# Patient Record
Sex: Female | Born: 1975
Health system: Southern US, Community
[De-identification: ages and names within clinical notes are randomized; demographics above are authoritative.]

## PROBLEM LIST (undated history)

## (undated) DIAGNOSIS — F17200 Nicotine dependence, unspecified, uncomplicated: Secondary | ICD-10-CM

## (undated) DIAGNOSIS — F988 Other specified behavioral and emotional disorders with onset usually occurring in childhood and adolescence: Secondary | ICD-10-CM

## (undated) DIAGNOSIS — E785 Hyperlipidemia, unspecified: Secondary | ICD-10-CM

## (undated) DIAGNOSIS — R079 Chest pain, unspecified: Secondary | ICD-10-CM

## (undated) DIAGNOSIS — E119 Type 2 diabetes mellitus without complications: Secondary | ICD-10-CM

## (undated) DIAGNOSIS — E559 Vitamin D deficiency, unspecified: Secondary | ICD-10-CM

## (undated) DIAGNOSIS — I471 Supraventricular tachycardia, unspecified: Secondary | ICD-10-CM

## (undated) DIAGNOSIS — R51 Headache: Secondary | ICD-10-CM

## (undated) DIAGNOSIS — E669 Obesity, unspecified: Secondary | ICD-10-CM

## (undated) DIAGNOSIS — F410 Panic disorder [episodic paroxysmal anxiety] without agoraphobia: Secondary | ICD-10-CM

## (undated) HISTORY — DX: Nicotine dependence, unspecified, uncomplicated: F17.200

## (undated) HISTORY — DX: Other specified behavioral and emotional disorders with onset usually occurring in childhood and adolescence: F98.8

## (undated) HISTORY — DX: Type 2 diabetes mellitus without complications: E11.9

## (undated) HISTORY — DX: Vitamin D deficiency, unspecified: E55.9

## (undated) HISTORY — DX: Supraventricular tachycardia, unspecified: I47.10

## (undated) HISTORY — DX: Headache: R51

## (undated) HISTORY — PX: WISDOM TOOTH EXTRACTION: SHX21

## (undated) HISTORY — DX: Hyperlipidemia, unspecified: E78.5

## (undated) HISTORY — DX: Chest pain, unspecified: R07.9

## (undated) HISTORY — DX: Obesity, unspecified: E66.9

## (undated) HISTORY — DX: Panic disorder (episodic paroxysmal anxiety): F41.0

---

## 2001-07-03 ENCOUNTER — Other Ambulatory Visit: Admission: RE | Admit: 2001-07-03 | Discharge: 2001-07-03 | Payer: Self-pay | Admitting: Obstetrics and Gynecology

## 2001-07-22 ENCOUNTER — Encounter: Payer: Self-pay | Admitting: Obstetrics and Gynecology

## 2001-07-22 ENCOUNTER — Ambulatory Visit (HOSPITAL_COMMUNITY): Admission: RE | Admit: 2001-07-22 | Discharge: 2001-07-22 | Payer: Self-pay | Admitting: Obstetrics and Gynecology

## 2001-12-14 ENCOUNTER — Inpatient Hospital Stay (HOSPITAL_COMMUNITY): Admission: AD | Admit: 2001-12-14 | Discharge: 2001-12-17 | Payer: Self-pay | Admitting: *Deleted

## 2003-01-27 ENCOUNTER — Other Ambulatory Visit: Admission: RE | Admit: 2003-01-27 | Discharge: 2003-01-27 | Payer: Self-pay | Admitting: Obstetrics and Gynecology

## 2003-07-13 ENCOUNTER — Emergency Department (HOSPITAL_COMMUNITY): Admission: EM | Admit: 2003-07-13 | Discharge: 2003-07-14 | Payer: Self-pay | Admitting: Emergency Medicine

## 2003-07-13 ENCOUNTER — Encounter: Payer: Self-pay | Admitting: Emergency Medicine

## 2004-03-01 ENCOUNTER — Ambulatory Visit (HOSPITAL_COMMUNITY): Admission: RE | Admit: 2004-03-01 | Discharge: 2004-03-01 | Payer: Self-pay | Admitting: Family Medicine

## 2004-03-09 ENCOUNTER — Other Ambulatory Visit: Admission: RE | Admit: 2004-03-09 | Discharge: 2004-03-09 | Payer: Self-pay | Admitting: Obstetrics and Gynecology

## 2005-02-11 ENCOUNTER — Ambulatory Visit: Payer: Self-pay | Admitting: Internal Medicine

## 2005-04-12 ENCOUNTER — Ambulatory Visit: Payer: Self-pay | Admitting: Internal Medicine

## 2005-06-28 ENCOUNTER — Other Ambulatory Visit: Admission: RE | Admit: 2005-06-28 | Discharge: 2005-06-28 | Payer: Self-pay | Admitting: Obstetrics and Gynecology

## 2005-10-11 ENCOUNTER — Ambulatory Visit: Payer: Self-pay | Admitting: Internal Medicine

## 2006-03-07 ENCOUNTER — Ambulatory Visit: Payer: Self-pay | Admitting: Family Medicine

## 2006-10-29 ENCOUNTER — Ambulatory Visit: Payer: Self-pay | Admitting: Internal Medicine

## 2007-03-06 ENCOUNTER — Ambulatory Visit: Payer: Self-pay | Admitting: Family Medicine

## 2007-09-16 ENCOUNTER — Telehealth (INDEPENDENT_AMBULATORY_CARE_PROVIDER_SITE_OTHER): Payer: Self-pay | Admitting: *Deleted

## 2007-09-17 ENCOUNTER — Ambulatory Visit: Payer: Self-pay | Admitting: Internal Medicine

## 2008-02-07 ENCOUNTER — Encounter: Payer: Self-pay | Admitting: Emergency Medicine

## 2008-02-07 ENCOUNTER — Inpatient Hospital Stay (HOSPITAL_COMMUNITY): Admission: AD | Admit: 2008-02-07 | Discharge: 2008-02-07 | Payer: Self-pay | Admitting: Obstetrics and Gynecology

## 2008-02-10 ENCOUNTER — Inpatient Hospital Stay (HOSPITAL_COMMUNITY): Admission: RE | Admit: 2008-02-10 | Discharge: 2008-02-10 | Payer: Self-pay | Admitting: Obstetrics and Gynecology

## 2008-02-23 ENCOUNTER — Inpatient Hospital Stay (HOSPITAL_COMMUNITY): Admission: AD | Admit: 2008-02-23 | Discharge: 2008-02-23 | Payer: Self-pay | Admitting: Obstetrics and Gynecology

## 2008-05-05 ENCOUNTER — Telehealth (INDEPENDENT_AMBULATORY_CARE_PROVIDER_SITE_OTHER): Payer: Self-pay | Admitting: *Deleted

## 2008-05-05 ENCOUNTER — Emergency Department (HOSPITAL_COMMUNITY): Admission: EM | Admit: 2008-05-05 | Discharge: 2008-05-05 | Payer: Self-pay | Admitting: Emergency Medicine

## 2008-05-18 ENCOUNTER — Telehealth (INDEPENDENT_AMBULATORY_CARE_PROVIDER_SITE_OTHER): Payer: Self-pay | Admitting: *Deleted

## 2009-01-02 ENCOUNTER — Ambulatory Visit: Payer: Self-pay | Admitting: Internal Medicine

## 2009-01-02 DIAGNOSIS — R51 Headache: Secondary | ICD-10-CM

## 2009-01-02 DIAGNOSIS — E1169 Type 2 diabetes mellitus with other specified complication: Secondary | ICD-10-CM | POA: Insufficient documentation

## 2009-01-02 DIAGNOSIS — F988 Other specified behavioral and emotional disorders with onset usually occurring in childhood and adolescence: Secondary | ICD-10-CM | POA: Insufficient documentation

## 2009-01-02 DIAGNOSIS — R519 Headache, unspecified: Secondary | ICD-10-CM | POA: Insufficient documentation

## 2009-01-02 DIAGNOSIS — E785 Hyperlipidemia, unspecified: Secondary | ICD-10-CM | POA: Insufficient documentation

## 2009-01-02 DIAGNOSIS — F17200 Nicotine dependence, unspecified, uncomplicated: Secondary | ICD-10-CM

## 2009-01-02 HISTORY — DX: Nicotine dependence, unspecified, uncomplicated: F17.200

## 2009-01-02 HISTORY — DX: Hyperlipidemia, unspecified: E78.5

## 2009-01-02 HISTORY — DX: Other specified behavioral and emotional disorders with onset usually occurring in childhood and adolescence: F98.8

## 2009-01-02 HISTORY — DX: Headache: R51

## 2009-01-30 ENCOUNTER — Telehealth: Payer: Self-pay | Admitting: Internal Medicine

## 2009-02-24 ENCOUNTER — Encounter: Payer: Self-pay | Admitting: Internal Medicine

## 2009-11-23 ENCOUNTER — Ambulatory Visit: Payer: Self-pay | Admitting: Internal Medicine

## 2009-11-23 DIAGNOSIS — J069 Acute upper respiratory infection, unspecified: Secondary | ICD-10-CM | POA: Insufficient documentation

## 2010-12-18 NOTE — Assessment & Plan Note (Signed)
Summary: ? bronchitis//ccm   Vital Signs:  Patient profile:   35 year old female Weight:      287 pounds BMI:     43.80 Temp:     99.2 degrees F oral BP sitting:   110 / 88  (left arm) Cuff size:   large  Vitals Entered By: Raechel Ache, RN (November 23, 2009 3:59 PM) CC: C/o fever, congestion, cough, ears hurt and sore throat.   CC:  C/o fever, congestion, cough, and ears hurt and sore throat.Marland Kitchen  History of Present Illness: 35 year old patient joint user, who presents with a 3-day history of fever, chest congestion, and cough.  She has had some sore throat, and minor ear discomfort.  She has no known allergies.  She has a history of ADD and has never filled her stimulant medication.  Denies any wheezing, shortness or breath or chest pain  Allergies: No Known Drug Allergies  Past History:  Past Medical History: Reviewed history from 01/02/2009 and no changes required. Headache ADD obesity Hyperlipidemia tobacco abuse  Review of Systems       The patient complains of anorexia, fever, weight gain, and prolonged cough.  The patient denies weight loss, vision loss, decreased hearing, hoarseness, chest pain, syncope, dyspnea on exertion, peripheral edema, headaches, hemoptysis, abdominal pain, melena, hematochezia, severe indigestion/heartburn, hematuria, incontinence, genital sores, muscle weakness, suspicious skin lesions, transient blindness, difficulty walking, depression, unusual weight change, abnormal bleeding, enlarged lymph nodes, angioedema, and breast masses.    Physical Exam  General:  overweight-appearing.  140/84overweight-appearing.   Head:  Normocephalic and atraumatic without obvious abnormalities. No apparent alopecia or balding. Eyes:  No corneal or conjunctival inflammation noted. EOMI. Perrla. Funduscopic exam benign, without hemorrhages, exudates or papilledema. Vision grossly normal. Ears:  External ear exam shows no significant lesions or deformities.   Otoscopic examination reveals clear canals, tympanic membranes are intact bilaterally without bulging, retraction, inflammation or discharge. Hearing is grossly normal bilaterally. Nose:  External nasal examination shows no deformity or inflammation. Nasal mucosa are pink and moist without lesions or exudates. Mouth:  Oral mucosa and oropharynx without lesions or exudates.  Teeth in good repair. Neck:  No deformities, masses, or tenderness noted. Lungs:  Normal respiratory effort, chest expands symmetrically. Lungs are clear to auscultation, no crackles or wheezes. Heart:  Normal rate and regular rhythm. S1 and S2 normal without gallop, murmur, click, rub or other extra sounds.   Impression & Recommendations:  Problem # 1:  URI (ICD-465.9)  Her updated medication list for this problem includes:    Hydrocodone-homatropine 5-1.5 Mg/76ml Syrp (Hydrocodone-homatropine) .Marland Kitchen... 1 teaspoon every 6 hours as needed for cough  Problem # 2:  TOBACCO ABUSE (ICD-305.1)  Her updated medication list for this problem includes:    Chantix Starting Month Pak 0.5 Mg X 11 & 1 Mg X 42 Tabs (Varenicline tartrate) .Marland Kitchen... As directed    Chantix 1 Mg Tabs (Varenicline tartrate) ..... One twice daily  Her updated medication list for this problem includes:    Chantix Starting Month Pak 0.5 Mg X 11 & 1 Mg X 42 Tabs (Varenicline tartrate) .Marland Kitchen... As directed    Chantix 1 Mg Tabs (Varenicline tartrate) ..... One twice daily  Complete Medication List: 1)  Depo-provera 150 Mg/ml Susp (Medroxyprogesterone acetate) .... Q 3 months 2)  Chantix Starting Month Pak 0.5 Mg X 11 & 1 Mg X 42 Tabs (Varenicline tartrate) .... As directed 3)  Chantix 1 Mg Tabs (Varenicline tartrate) .... One  twice daily 4)  Amphetamine-dextroamphetamine 20 Mg Xr24h-cap (Amphetamine-dextroamphetamine) .... One daily 5)  Hydrocodone-homatropine 5-1.5 Mg/23ml Syrp (Hydrocodone-homatropine) .Marland Kitchen.. 1 teaspoon every 6 hours as needed for cough  Patient  Instructions: 1)  Get plenty of rest, drink lots of clear liquids, and use Tylenol or Ibuprofen for fever and comfort. Return in 7-10 days if you're not better:sooner if you're feeling worse. Prescriptions: HYDROCODONE-HOMATROPINE 5-1.5 MG/5ML SYRP (HYDROCODONE-HOMATROPINE) 1 teaspoon every 6 hours as needed for cough  #6 oz x 0   Entered and Authorized by:   Gordy Savers  MD   Signed by:   Gordy Savers  MD on 11/23/2009   Method used:   Print then Give to Patient   RxID:   [id number]

## 2010-12-18 NOTE — Letter (Signed)
Summary: Out of Work  Adult nurse at [employer]  [address]   [place], [place] [address]   Phone: [phone]  Fax: [phone]    November 23, 2009   Employee:  Angela Townsend [facility] [employer]    To Whom It May Concern:   For Medical reasons, please excuse the above named employee from work for the following dates:  Start:   11-22-2009  End:   11-27-2009  If you need additional information, please feel free to contact our office.         Sincerely,    Gordy Savers  MD

## 2011-01-22 ENCOUNTER — Other Ambulatory Visit: Payer: Managed Care, Other (non HMO)

## 2011-01-22 ENCOUNTER — Ambulatory Visit (INDEPENDENT_AMBULATORY_CARE_PROVIDER_SITE_OTHER): Payer: Managed Care, Other (non HMO) | Admitting: Internal Medicine

## 2011-01-22 ENCOUNTER — Encounter: Payer: Self-pay | Admitting: Internal Medicine

## 2011-01-22 VITALS — BP 122/70 | HR 84 | Temp 98.0°F | Resp 18 | Ht 66.5 in | Wt 293.0 lb

## 2011-01-22 DIAGNOSIS — E785 Hyperlipidemia, unspecified: Secondary | ICD-10-CM

## 2011-01-22 DIAGNOSIS — F17200 Nicotine dependence, unspecified, uncomplicated: Secondary | ICD-10-CM

## 2011-01-22 DIAGNOSIS — Z0000 Encounter for general adult medical examination without abnormal findings: Secondary | ICD-10-CM

## 2011-01-22 DIAGNOSIS — F988 Other specified behavioral and emotional disorders with onset usually occurring in childhood and adolescence: Secondary | ICD-10-CM

## 2011-01-22 LAB — BASIC METABOLIC PANEL
BUN: 10 mg/dL (ref 6–23)
CO2: 26 mEq/L (ref 19–32)
Calcium: 9.3 mg/dL (ref 8.4–10.5)
Chloride: 106 mEq/L (ref 96–112)
Creatinine, Ser: 1 mg/dL (ref 0.4–1.2)
GFR: 66.28 mL/min (ref >60.00)
Glucose, Bld: 108 mg/dL — ABNORMAL HIGH (ref 70–99)
Potassium: 4.5 mEq/L (ref 3.5–5.1)
Sodium: 140 mEq/L (ref 135–145)

## 2011-01-22 LAB — CBC WITH DIFFERENTIAL/PLATELET
Basophils Absolute: 0 10*3/uL (ref 0.0–0.1)
Basophils Relative: 0.2 % (ref 0.0–3.0)
Eosinophils Absolute: 0.2 10*3/uL (ref 0.0–0.7)
Eosinophils Relative: 2.9 % (ref 0.0–5.0)
HCT: 42.1 % (ref 36.0–46.0)
Hemoglobin: 14.3 g/dL (ref 12.0–15.0)
Lymphocytes Relative: 37.3 % (ref 12.0–46.0)
Lymphs Abs: 2.5 10*3/uL (ref 0.7–4.0)
MCHC: 34 g/dL (ref 30.0–36.0)
MCV: 88.6 fl (ref 78.0–100.0)
Monocytes Absolute: 0.2 10*3/uL (ref 0.1–1.0)
Monocytes Relative: 3.4 % (ref 3.0–12.0)
Neutro Abs: 3.8 10*3/uL (ref 1.4–7.7)
Neutrophils Relative %: 56.2 % (ref 43.0–77.0)
Platelets: 191 10*3/uL (ref 150.0–400.0)
RBC: 4.75 Mil/uL (ref 3.87–5.11)
RDW: 14 % (ref 11.5–14.6)
WBC: 6.7 10*3/uL (ref 4.5–10.5)

## 2011-01-22 LAB — LDL CHOLESTEROL, DIRECT: Direct LDL: 192.6 mg/dL

## 2011-01-22 LAB — POCT URINALYSIS DIPSTICK
Bilirubin, UA: NEGATIVE
Glucose, UA: NEGATIVE
Ketones, UA: NEGATIVE
Spec Grav, UA: 1.03

## 2011-01-22 LAB — HEPATIC FUNCTION PANEL
ALT: 21 U/L (ref 0–35)
AST: 21 U/L (ref 0–37)
Albumin: 4 g/dL (ref 3.5–5.2)
Alkaline Phosphatase: 56 U/L (ref 39–117)
Bilirubin, Direct: 0.1 mg/dL (ref 0.0–0.3)
Total Bilirubin: 1.2 mg/dL (ref 0.3–1.2)
Total Protein: 6.9 g/dL (ref 6.0–8.3)

## 2011-01-22 LAB — TSH: TSH: 1.32 u[IU]/mL (ref 0.35–5.50)

## 2011-01-22 MED ORDER — AMPHETAMINE-DEXTROAMPHET ER 20 MG PO CP24: 20.0000 mg | ORAL_CAPSULE | ORAL | Status: DC

## 2011-01-22 NOTE — Progress Notes (Signed)
  Subjective:    Patient ID: Angela Townsend, female    DOB: Jul 05, 1976, 35 y.o.   MRN: [id number]  HPI   35 year old patient who is seen today for a health maintenance examination. She is followed by gynecology. She is a gravida 3 para 2 abortus one on Depo-Provera. She has ADHD but takes Adderall infrequently. She has a history of exogenous obesity and mild dyslipidemia. She has a history also of ongoing tobacco use and has cut her tobacco consumption down to one half pack daily she has been on Chantix in the past.   Review of Systems  Constitutional: Negative for fever, appetite change, fatigue and unexpected weight change.  HENT: Negative for hearing loss, ear pain, nosebleeds, congestion, sore throat, mouth sores, trouble swallowing, neck stiffness, dental problem, voice change, sinus pressure and tinnitus.   Eyes: Negative for photophobia, pain, redness and visual disturbance.  Respiratory: Negative for cough, chest tightness and shortness of breath.   Cardiovascular: Negative for chest pain, palpitations and leg swelling.  Gastrointestinal: Negative for nausea, vomiting, abdominal pain, diarrhea, constipation, blood in stool, abdominal distention and rectal pain.  Genitourinary: Negative for dysuria, urgency, frequency, hematuria, flank pain, vaginal bleeding, vaginal discharge, difficulty urinating, genital sores, vaginal pain, menstrual problem and pelvic pain.  Musculoskeletal: Negative for back pain and arthralgias.  Skin: Negative for rash.  Neurological: Negative for dizziness, syncope, speech difficulty, weakness, light-headedness, numbness and headaches.  Hematological: Negative for adenopathy. Does not bruise/bleed easily.  Psychiatric/Behavioral: Negative for suicidal ideas, behavioral problems, self-injury, dysphoric mood and agitation. The patient is not nervous/anxious.        Objective:   Physical Exam  Constitutional: She is oriented to person, place, and time. She  appears well-developed and well-nourished.  HENT:  Head: Normocephalic and atraumatic.  Right Ear: External ear normal.  Left Ear: External ear normal.  Mouth/Throat: Oropharynx is clear and moist.  Eyes: Conjunctivae and EOM are normal.  Neck: Normal range of motion. Neck supple. No JVD present. No thyromegaly present.  Cardiovascular: Normal rate, regular rhythm, normal heart sounds and intact distal pulses.   No murmur heard. Pulmonary/Chest: Effort normal and breath sounds normal. She has no wheezes. She has no rales.  Abdominal: Soft. Bowel sounds are normal. She exhibits no distension and no mass. There is no tenderness. There is no rebound and no guarding.  Musculoskeletal: Normal range of motion. She exhibits no edema and no tenderness.  Neurological: She is alert and oriented to person, place, and time. She has normal reflexes. No cranial nerve deficit. She exhibits normal muscle tone. Coordination normal.  Skin: Skin is warm and dry. No rash noted.  Psychiatric: She has a normal mood and affect. Her behavior is normal.          Assessment & Plan:   preventive health examination  Exogenous obesity  History of mild dyslipidemia- lipid profile pending  ADHD. Medicines refilled  Ongoing tobacco use. Total cessation of tobacco products encouraged

## 2011-01-22 NOTE — Patient Instructions (Signed)
It is important that you exercise regularly, at least 20 minutes 3 to 4 times per week.  If you develop chest pain or shortness of breath seek  medical attention.You need to lose weight.  Consider a lower calorie diet and regular exercise. You need to lose weight.    Smoking tobacco is very bad for your health. You should stop smoking immediately.

## 2011-01-25 ENCOUNTER — Encounter: Payer: Self-pay | Admitting: Internal Medicine

## 2011-01-25 ENCOUNTER — Other Ambulatory Visit: Payer: Self-pay | Admitting: Internal Medicine

## 2011-01-25 MED ORDER — ATORVASTATIN CALCIUM 40 MG PO TABS: 40.0000 mg | ORAL_TABLET | Freq: Every day | ORAL | Status: DC

## 2011-01-25 NOTE — Progress Notes (Signed)
Quick Note:  Called pt and informed of labs and new med needed. Also will need rov in 3 mos to do labs .  Will sent rx [facility]. KIK ______

## 2011-04-05 NOTE — Discharge Summary (Signed)
[facility]  Patient:    VALA, RAFFO Visit Number: [id number] MRN: [id number]          Service Type: OBS Location: [id number] Attending Physician:  Michaele Offer Dictated by:   Zenaida Niece, M.D. Admit Date:  12/14/2001 Discharge Date: 12/17/2001                             Discharge Summary  ADMISSION DIAGNOSES: 1. Intrauterine pregnancy at 39 weeks. 2. Premature rupture of membranes.  DISCHARGE DIAGNOSES: 1. Intrauterine pregnancy at 39 weeks. 2. Premature rupture of membranes.  PROCEDURE:  Spontaneous vaginal delivery.  COMPLICATIONS:  None.  CONSULTATIONS:  None.  HISTORY OF PRESENT ILLNESS:  This is a 35 year old Hispanic female, gravida 2, para 1-0-0-1, with an estimated gestational age of [redacted] weeks by a 13 week ultrasound with a due date of December 17, 2001, who presented to the office with the complaint of rupture of membranes with clear fluid at 0600 to 0700 on December 14, 2001, with occasional contractions, no bleeding, and good fetal movement.  Evaluation in the office revealed positive rupture of membranes, and she was 1, 50, -2, with a vertex presentation, and she was sent for admission and to start Pitocin.  Prenatal care has been uncomplicated.  PRENATAL LABORATORY DATA:  Blood type is A+ with a negative antibiotic screen, RPR nonreactive, rubella immune, hepatitis B surface antigen negative, HIV negative, gonorrhea and chlamydia negative.  Triple screen normal.  One hour glucola 126.  Group B strep is negative.  PAST OBSTETRICAL HISTORY:  In October 1998, vaginal delivery at 40 weeks that was induced, 6 pounds 11 ounces, no complications.  PAST MEDICAL HISTORY: 1. Asthma as a child. 2. History of migraine headaches.  PHYSICAL EXAMINATION:  VITAL SIGNS:  Afebrile, with stable vital signs.  Fetal heart rate tracing reactive without decelerations and rare contractions.  ABDOMEN:  Gravid,  nontender, with a fundal height of 38 cm, and an estimated fetal weight of 7.5 pounds.  PELVIC:  Vaginal examination is 1, 50, -2, and vertex.  HOSPITAL COURSE:  The patient was sent to the hospital and had to wait in triage for several hours for a bed.  She did eventually reach labor and delivery, and was started on Pitocin.  She initially progressed very slowly, receiving IV pain medication.  She then received an epidural as IV pain medication was not working very well.  This was when she was still 1 cm.  She then progressed rapidly to 6 cm, and to complete.  Early on the morning of December 15, 2001, she had a vaginal delivery of a viable female infant with Apgars of 8 and 9, that weighed 6 pounds 15 ounces over a small second degree laceration.  Placenta delivered spontaneous and was intact.  The small second degree laceration was repaired with a figure-of-eight of 3-0 Vicryl for hemostasis, and estimated blood loss was less than 500 cc.  Postpartum, the patient did very well, remained afebrile, and breast-fed her baby without complications.  Pre-delivery hemoglobin was 12.9, post-delivery was 11.8.  On the morning of postpartum day #2, the patient was felt to be stable enough for discharge home.  CONDITION ON DISCHARGE:  Stable.  DISPOSITION:  Discharged to home.  DIET:  Regular.  ACTIVITY:  Pelvic rest.  FOLLOWUP:  In 4 to 6 weeks.  DISCHARGE MEDICATIONS:  Over-the-counter Motrin or Aleve p.r.n.  She is given our  discharge pamphlet.Dictated by:   Zenaida Niece, M.D.  Attending Physician:  Michaele Offer DD:  12/17/01 TD:  12/17/01 Job: [id number]

## 2011-06-19 ENCOUNTER — Telehealth: Payer: Self-pay | Admitting: Internal Medicine

## 2011-06-19 NOTE — Telephone Encounter (Signed)
Please advise 

## 2011-06-19 NOTE — Telephone Encounter (Signed)
Pt called 8/1. Is out of her Adderall and would like to pick up a script. I did explain that our turn around time on Rx is approx 24-72 hrs, and that she needs to call before she is out of meds. At any rate, I told her I would send you the message about the refill, and that when it was ready for her we would call. Thanks!

## 2011-06-20 MED ORDER — AMPHETAMINE-DEXTROAMPHET ER 20 MG PO CP24: 20.0000 mg | ORAL_CAPSULE | ORAL | Status: DC

## 2011-06-20 NOTE — Telephone Encounter (Signed)
rx ready for pick up. Left message on machine for patient. 

## 2011-06-20 NOTE — Telephone Encounter (Signed)
OK 

## 2011-08-12 LAB — CBC
HCT: 38.2
Hemoglobin: 13
MCHC: 33.9
MCV: 85.6
Platelets: 194
RBC: 4.46
RDW: 14.4
WBC: 10.8 — ABNORMAL HIGH

## 2011-08-12 LAB — DIFFERENTIAL
Basophils Absolute: 0
Basophils Relative: 0
Eosinophils Absolute: 0.1
Eosinophils Relative: 1
Lymphocytes Relative: 20
Lymphs Abs: 2.2
Monocytes Absolute: 0.3
Monocytes Relative: 3
Neutro Abs: 8.2 — ABNORMAL HIGH
Neutrophils Relative %: 76

## 2011-08-12 LAB — BASIC METABOLIC PANEL
BUN: 7
CO2: 24
Chloride: 105
Creatinine, Ser: 0.8

## 2011-08-12 LAB — URINALYSIS, ROUTINE W REFLEX MICROSCOPIC
Bilirubin Urine: NEGATIVE
Glucose, UA: NEGATIVE
Hgb urine dipstick: NEGATIVE
Specific Gravity, Urine: 1.025
pH: 5.5

## 2011-08-12 LAB — URINE MICROSCOPIC-ADD ON

## 2011-08-12 LAB — BASIC METABOLIC PANEL WITH GFR
Calcium: 8.7
GFR calc Af Amer: >60
GFR calc non Af Amer: >60
Glucose, Bld: 135 — ABNORMAL HIGH
Potassium: 3.7
Sodium: 135

## 2011-08-12 LAB — ABO/RH: ABO/RH(D): A POS

## 2011-08-12 LAB — WET PREP, GENITAL
Clue Cells Wet Prep HPF POC: NONE SEEN
Trich, Wet Prep: NONE SEEN
WBC, Wet Prep HPF POC: NONE SEEN
Yeast Wet Prep HPF POC: NONE SEEN

## 2011-08-12 LAB — HCG, QUANTITATIVE, PREGNANCY
hCG, Beta Chain, Quant, S: 2775 — ABNORMAL HIGH
hCG, Beta Chain, Quant, S: 2255 — ABNORMAL HIGH

## 2011-09-30 ENCOUNTER — Ambulatory Visit: Payer: Managed Care, Other (non HMO) | Admitting: Internal Medicine

## 2011-09-30 DIAGNOSIS — Z0289 Encounter for other administrative examinations: Secondary | ICD-10-CM

## 2011-10-09 ENCOUNTER — Other Ambulatory Visit: Payer: Self-pay | Admitting: Internal Medicine

## 2011-10-09 NOTE — Telephone Encounter (Signed)
Pt req refill of amphetamine-dextroamphetamine (ADDERALL XR) 20 MG 24 hr capsule. Pt says that her spouse will pick up script when its ready on Friday.

## 2011-10-11 NOTE — Telephone Encounter (Signed)
ok 

## 2011-10-11 NOTE — Telephone Encounter (Signed)
Please advise - last seen 01/2011 

## 2011-10-14 MED ORDER — AMPHETAMINE-DEXTROAMPHET ER 20 MG PO CP24: 20.0000 mg | ORAL_CAPSULE | ORAL | Status: DC

## 2011-10-14 NOTE — Telephone Encounter (Signed)
Attempt to call - VM - LMTCB if questions - rx ready for pick up

## 2012-01-22 ENCOUNTER — Other Ambulatory Visit: Payer: Self-pay | Admitting: Family Medicine

## 2012-01-22 MED ORDER — AMPHETAMINE-DEXTROAMPHET ER 20 MG PO CP24: 20.0000 mg | ORAL_CAPSULE | ORAL | Status: DC

## 2012-01-22 NOTE — Telephone Encounter (Signed)
Pt requesting refill on Adderall, 90-day supply if possible. Please call when ready for pick up. Thank you.

## 2012-01-22 NOTE — Telephone Encounter (Signed)
Attempt to call back - VM - LMTCB and make appt - cant give 90 day rx- controlled substance , also needs to be seen - last seen 01/2011 - can give rx for 1- 30day only, will be ready in the AM for pick up -

## 2012-02-07 ENCOUNTER — Encounter: Payer: Self-pay | Admitting: Internal Medicine

## 2012-02-07 ENCOUNTER — Ambulatory Visit (INDEPENDENT_AMBULATORY_CARE_PROVIDER_SITE_OTHER): Payer: Managed Care, Other (non HMO) | Admitting: Internal Medicine

## 2012-02-07 VITALS — BP 120/82 | Temp 98.1°F | Wt 268.0 lb

## 2012-02-07 DIAGNOSIS — F988 Other specified behavioral and emotional disorders with onset usually occurring in childhood and adolescence: Secondary | ICD-10-CM

## 2012-02-07 DIAGNOSIS — E785 Hyperlipidemia, unspecified: Secondary | ICD-10-CM

## 2012-02-07 DIAGNOSIS — F17200 Nicotine dependence, unspecified, uncomplicated: Secondary | ICD-10-CM

## 2012-02-07 DIAGNOSIS — E669 Obesity, unspecified: Secondary | ICD-10-CM

## 2012-02-07 LAB — LIPID PANEL
Cholesterol: 287 mg/dL — ABNORMAL HIGH (ref 0–200)
Total CHOL/HDL Ratio: 7

## 2012-02-07 MED ORDER — AMPHETAMINE-DEXTROAMPHET ER 20 MG PO CP24: 20.0000 mg | ORAL_CAPSULE | ORAL | Status: DC

## 2012-02-07 NOTE — Progress Notes (Signed)
  Subjective:    Patient ID: Angela Townsend, female    DOB: 03-13-1976, 36 y.o.   MRN: [id number]  HPI  36 year old patient who is in today for followup. She was seen here one year ago and placed on the Adderall. She hasn't taken this daily rather than sporadically. She feels much improved on this medication and is much more functional. She has enjoyed a 25 pound weight loss over the past year. She attributes this to better eating habits a bit more exercise and also the affects of the Adderall. Weight is 268. LDL cholesterol one year ago was 192 and atorvastatin therapy was recommended. However she never started treatment. She feels well today. She continues to smoke one half pack of cigarettes daily    Review of Systems  Constitutional: Negative.   HENT: Negative for hearing loss, congestion, sore throat, rhinorrhea, dental problem, sinus pressure and tinnitus.   Eyes: Negative for pain, discharge and visual disturbance.  Respiratory: Negative for cough and shortness of breath.   Cardiovascular: Negative for chest pain, palpitations and leg swelling.  Gastrointestinal: Negative for nausea, vomiting, abdominal pain, diarrhea, constipation, blood in stool and abdominal distention.  Genitourinary: Negative for dysuria, urgency, frequency, hematuria, flank pain, vaginal bleeding, vaginal discharge, difficulty urinating, vaginal pain and pelvic pain.  Musculoskeletal: Negative for joint swelling, arthralgias and gait problem.  Skin: Negative for rash.  Neurological: Negative for dizziness, syncope, speech difficulty, weakness, numbness and headaches.  Hematological: Negative for adenopathy.  Psychiatric/Behavioral: Negative for behavioral problems, dysphoric mood and agitation. The patient is not nervous/anxious.        Objective:   Physical Exam  Constitutional: She is oriented to person, place, and time. She appears well-developed and well-nourished.  HENT:  Head: Normocephalic.    Right Ear: External ear normal.  Left Ear: External ear normal.  Mouth/Throat: Oropharynx is clear and moist.  Eyes: Conjunctivae and EOM are normal. Pupils are equal, round, and reactive to light.  Neck: Normal range of motion. Neck supple. No thyromegaly present.  Cardiovascular: Normal rate, regular rhythm, normal heart sounds and intact distal pulses.   Pulmonary/Chest: Effort normal and breath sounds normal.  Abdominal: Soft. Bowel sounds are normal. She exhibits no mass. There is no tenderness.  Musculoskeletal: Normal range of motion.  Lymphadenopathy:    She has no cervical adenopathy.  Neurological: She is alert and oriented to person, place, and time.  Skin: Skin is warm and dry. No rash noted.  Psychiatric: She has a normal mood and affect. Her behavior is normal.          Assessment & Plan:   Dyslipidemia. We'll check a nonfasting LDL cholesterol Obesity. Continued efforts at weight loss exercise and weight loss ADHD. Much improved on Adderall therapy daily Ongoing tobacco use. Total smoking cessation encouraged

## 2012-02-07 NOTE — Patient Instructions (Signed)
Limit your sodium (Salt) intake    It is important that you exercise regularly, at least 20 minutes 3 to 4 times per week.  If you develop chest pain or shortness of breath seek  medical attention.  You need to lose weight.  Consider a lower calorie diet and regular exercise.  Smoking tobacco is very bad for your health. You should stop smoking immediately. 

## 2012-02-10 ENCOUNTER — Other Ambulatory Visit: Payer: Self-pay | Admitting: Internal Medicine

## 2012-02-10 MED ORDER — ATORVASTATIN CALCIUM 40 MG PO TABS: 40.0000 mg | ORAL_TABLET | Freq: Every day | ORAL | Status: DC

## 2012-02-10 NOTE — Progress Notes (Signed)
Quick Note:  Spoke with pt- informed of results and new med to start [facility]

## 2012-06-04 ENCOUNTER — Other Ambulatory Visit: Payer: Self-pay | Admitting: Internal Medicine

## 2012-06-04 MED ORDER — AMPHETAMINE-DEXTROAMPHET ER 20 MG PO CP24: 20.0000 mg | ORAL_CAPSULE | ORAL | Status: DC

## 2012-06-04 NOTE — Telephone Encounter (Signed)
rx up front ready for p/u, pt aware 

## 2012-06-04 NOTE — Telephone Encounter (Signed)
Pt needs generic adderall xr 20 m g °

## 2012-06-04 NOTE — Telephone Encounter (Signed)
ok 

## 2012-09-09 ENCOUNTER — Other Ambulatory Visit: Payer: Self-pay | Admitting: Internal Medicine

## 2012-09-09 MED ORDER — AMPHETAMINE-DEXTROAMPHET ER 20 MG PO CP24: 20.0000 mg | ORAL_CAPSULE | ORAL | Status: DC

## 2012-09-09 NOTE — Telephone Encounter (Signed)
Attempt to call - Vm - LMTCB - rx's ready for pick up

## 2012-09-09 NOTE — Telephone Encounter (Signed)
Pt needs new rx generic adderall xr 20 mg °

## 2012-10-26 ENCOUNTER — Encounter: Payer: Self-pay | Admitting: Family

## 2012-10-26 ENCOUNTER — Ambulatory Visit (INDEPENDENT_AMBULATORY_CARE_PROVIDER_SITE_OTHER): Payer: Managed Care, Other (non HMO) | Admitting: Family

## 2012-10-26 VITALS — BP 104/76 | HR 110 | Temp 98.7°F | Wt 260.0 lb

## 2012-10-26 DIAGNOSIS — R059 Cough, unspecified: Secondary | ICD-10-CM

## 2012-10-26 DIAGNOSIS — R05 Cough: Secondary | ICD-10-CM

## 2012-10-26 DIAGNOSIS — J069 Acute upper respiratory infection, unspecified: Secondary | ICD-10-CM

## 2012-10-26 MED ORDER — GUAIFENESIN-CODEINE 100-10 MG/5ML PO SYRP: 5.0000 mL | ORAL_SOLUTION | Freq: Three times a day (TID) | ORAL | Status: DC | PRN

## 2012-10-26 NOTE — Patient Instructions (Addendum)

## 2012-10-26 NOTE — Progress Notes (Signed)
Subjective:    Patient ID: Angela Townsend, female    DOB: [birth year], 36 y.o.   MRN: [id number]  HPI 36 year old female, nonsmoker, patient of Dr. Kirtland Bouchard. is in today with complaints of cough, congestion, fever, chills, body aches x3 days. Reports a fever of 100-101 over the weekend. Denies any nausea, vomiting, or headaches. She's concerned that she has the flu. Has been taken over-the-counter medication no relief.   Review of Systems  Constitutional: Positive for fever, chills and fatigue.  HENT: Positive for congestion and postnasal drip.   Respiratory: Positive for cough. Negative for shortness of breath and wheezing.   Cardiovascular: Negative.   Gastrointestinal: Negative.   Musculoskeletal: Positive for myalgias.  Skin: Negative.   Neurological: Negative.   Hematological: Negative.   Psychiatric/Behavioral: Negative.    Past Medical History  Diagnosis Date  . ADD 01/02/2009  . Headache 01/02/2009  . HYPERLIPIDEMIA 01/02/2009  . TOBACCO ABUSE 01/02/2009  . Obesity     History   Social History  . Marital Status: Married    Spouse Name: N/A    Number of Children: N/A  . Years of Education: N/A   Occupational History  . Not on file.   Social History Main Topics  . Smoking status: Current Every Day Smoker -- 0.5 packs/day    Types: Cigarettes  . Smokeless tobacco: Never Used  . Alcohol Use: Yes     Comment: occasional  . Drug Use: No  . Sexually Active: Not on file   Other Topics Concern  . Not on file   Social History Narrative  . No narrative on file    Past Surgical History  Procedure Date  . Wisdom tooth extraction     Family History  Problem Relation Age of Onset  . Diabetes Mother   . Hyperlipidemia Mother   . Birth defects Maternal Grandmother     breast, colon, brain ca    No Known Allergies  Current Outpatient Prescriptions on File Prior to Visit  Medication Sig Dispense Refill  . amphetamine-dextroamphetamine (ADDERALL XR) 20 MG 24 hr  capsule Take 1 capsule (20 mg total) by mouth every morning.  30 capsule  0  . atorvastatin (LIPITOR) 40 MG tablet Take 1 tablet (40 mg total) by mouth daily.  90 tablet  2  . atorvastatin (LIPITOR) 40 MG tablet Take 1 tablet (40 mg total) by mouth daily.  90 tablet  3    BP 104/76  Pulse 110  Temp 98.7 F (37.1 C) (Oral)  Wt 260 lb (117.935 kg)  SpO2 96%chart      Objective:   Physical Exam  Constitutional: She is oriented to person, place, and time. She appears well-developed and well-nourished.  HENT:  Right Ear: External ear normal.  Left Ear: External ear normal.  Nose: Nose normal.  Mouth/Throat: Oropharynx is clear and moist.  Neck: Normal range of motion. Neck supple.  Cardiovascular: Normal rate, regular rhythm and normal heart sounds.   Pulmonary/Chest: Effort normal and breath sounds normal.  Abdominal: Soft. Bowel sounds are normal.  Neurological: She is alert and oriented to person, place, and time.  Skin: Skin is warm and dry.  Psychiatric: She has a normal mood and affect.          Assessment & Plan:  Assessment: Upper respiratory infection, cough  Plan: Note given for work to return 10/28/2012. Robitussin with codeine 1 teaspoon every 8 hours as needed for cough. Rest. Drink plenty of fluids. Patient call the office  if symptoms worsen or persist. Recheck a schedule, and when necessary.

## 2012-12-08 ENCOUNTER — Telehealth: Payer: Self-pay | Admitting: Internal Medicine

## 2012-12-08 NOTE — Telephone Encounter (Signed)
Pt needs to have a cpx completed before 01/02/13 for insurance purposes can pt be worked in ... Next cpx is not until 2/2. Please advise

## 2012-12-08 NOTE — Telephone Encounter (Signed)
Okay to use two openings for CPX before 2/15.

## 2012-12-09 NOTE — Telephone Encounter (Signed)
Lmom requesting pt to call back and schedule appt. Pt aware anyone that answers the phone will be able to assist her.

## 2012-12-14 ENCOUNTER — Other Ambulatory Visit: Payer: Self-pay | Admitting: Internal Medicine

## 2012-12-14 MED ORDER — AMPHETAMINE-DEXTROAMPHET ER 20 MG PO CP24: 20.0000 mg | ORAL_CAPSULE | ORAL | Status: DC

## 2012-12-14 NOTE — Telephone Encounter (Signed)
Pt needs new rx generic adderall xr 20 mg. Pt has 2 pills left

## 2012-12-14 NOTE — Telephone Encounter (Signed)
30 day Rx ready for pick up and patient is aware. 

## 2013-01-18 ENCOUNTER — Telehealth: Payer: Self-pay | Admitting: Internal Medicine

## 2013-01-18 NOTE — Telephone Encounter (Signed)
Patient called stating that she need a refill of her adderall xr 20mg  24 hr caps 1poqd. Please assist.

## 2013-01-18 NOTE — Telephone Encounter (Signed)
Pt needs an appointment has not been seen since 02/07/2012 by Dr. Amador Cunas can not refill Adderall again until pt is seen.

## 2013-01-19 NOTE — Telephone Encounter (Signed)
lmovm to schedule

## 2013-01-21 ENCOUNTER — Ambulatory Visit (INDEPENDENT_AMBULATORY_CARE_PROVIDER_SITE_OTHER): Payer: Managed Care, Other (non HMO) | Admitting: Internal Medicine

## 2013-01-21 ENCOUNTER — Encounter: Payer: Self-pay | Admitting: Internal Medicine

## 2013-01-21 VITALS — BP 120/88 | Temp 97.8°F | Wt 263.0 lb

## 2013-01-21 DIAGNOSIS — F17200 Nicotine dependence, unspecified, uncomplicated: Secondary | ICD-10-CM

## 2013-01-21 DIAGNOSIS — E785 Hyperlipidemia, unspecified: Secondary | ICD-10-CM

## 2013-01-21 DIAGNOSIS — F411 Generalized anxiety disorder: Secondary | ICD-10-CM | POA: Insufficient documentation

## 2013-01-21 DIAGNOSIS — F988 Other specified behavioral and emotional disorders with onset usually occurring in childhood and adolescence: Secondary | ICD-10-CM

## 2013-01-21 MED ORDER — AMPHETAMINE-DEXTROAMPHET ER 20 MG PO CP24: 20.0000 mg | ORAL_CAPSULE | ORAL | Status: DC

## 2013-01-21 NOTE — Progress Notes (Signed)
Subjective:    Patient ID: Angela Townsend, female    DOB: Apr 09, 1976, 37 y.o.   MRN: [id number]  HPI  37 year old patient who is seen today for followup. She has a history of ADHD which has been managed with Adderall XR 20 mg daily. She does have a history of anxiety disorder and a week ago had some increased anxiety associated with tachycardia. This was intermittent throughout one morning. She does not feel that the Adderall has terribly aggravated her anxiety and feels that she derives great benefit.  Past Medical History  Diagnosis Date  . ADD 01/02/2009  . Headache 01/02/2009  . HYPERLIPIDEMIA 01/02/2009  . TOBACCO ABUSE 01/02/2009  . Obesity     History   Social History  . Marital Status: Married    Spouse Name: N/A    Number of Children: N/A  . Years of Education: N/A   Occupational History  . Not on file.   Social History Main Topics  . Smoking status: Current Every Day Smoker -- 0.50 packs/day    Types: Cigarettes  . Smokeless tobacco: Never Used  . Alcohol Use: Yes     Comment: occasional  . Drug Use: No  . Sexually Active: Not on file   Other Topics Concern  . Not on file   Social History Narrative  . No narrative on file    Past Surgical History  Procedure Laterality Date  . Wisdom tooth extraction      Family History  Problem Relation Age of Onset  . Diabetes Mother   . Hyperlipidemia Mother   . Birth defects Maternal Grandmother     breast, colon, brain ca    No Known Allergies  Current Outpatient Prescriptions on File Prior to Visit  Medication Sig Dispense Refill  . amphetamine-dextroamphetamine (ADDERALL XR) 20 MG 24 hr capsule Take 1 capsule (20 mg total) by mouth every morning.  30 capsule  0  . atorvastatin (LIPITOR) 40 MG tablet Take 1 tablet (40 mg total) by mouth daily.  90 tablet  2  . atorvastatin (LIPITOR) 40 MG tablet Take 1 tablet (40 mg total) by mouth daily.  90 tablet  3   No current facility-administered medications on  file prior to visit.    BP 120/88  Temp(Src) 97.8 F (36.6 C) (Oral)  Wt 263 lb (119.296 kg)  BMI 41.82 kg/m2       Review of Systems  Constitutional: Negative.   HENT: Negative for hearing loss, congestion, sore throat, rhinorrhea, dental problem, sinus pressure and tinnitus.   Eyes: Negative for pain, discharge and visual disturbance.  Respiratory: Negative for cough and shortness of breath.   Cardiovascular: Negative for chest pain, palpitations and leg swelling.  Gastrointestinal: Negative for nausea, vomiting, abdominal pain, diarrhea, constipation, blood in stool and abdominal distention.  Genitourinary: Negative for dysuria, urgency, frequency, hematuria, flank pain, vaginal bleeding, vaginal discharge, difficulty urinating, vaginal pain and pelvic pain.  Musculoskeletal: Negative for joint swelling, arthralgias and gait problem.  Skin: Negative for rash.  Neurological: Negative for dizziness, syncope, speech difficulty, weakness, numbness and headaches.  Hematological: Negative for adenopathy.  Psychiatric/Behavioral: Negative for behavioral problems, dysphoric mood and agitation. The patient is nervous/anxious.        Objective:   Physical Exam  Constitutional: She is oriented to person, place, and time. She appears well-developed and well-nourished.  Weight 263  HENT:  Head: Normocephalic.  Right Ear: External ear normal.  Left Ear: External ear normal.  Mouth/Throat: Oropharynx is clear  and moist.  Eyes: Conjunctivae and EOM are normal. Pupils are equal, round, and reactive to light.  Neck: Normal range of motion. Neck supple. No thyromegaly present.  Cardiovascular: Normal rate, regular rhythm, normal heart sounds and intact distal pulses.   Pulmonary/Chest: Effort normal and breath sounds normal.  Abdominal: Soft. Bowel sounds are normal. She exhibits no mass. There is no tenderness.  Musculoskeletal: Normal range of motion.  Lymphadenopathy:    She has no  cervical adenopathy.  Neurological: She is alert and oriented to person, place, and time.  Skin: Skin is warm and dry. No rash noted.  Psychiatric: She has a normal mood and affect. Her behavior is normal.          Assessment & Plan:   ADHD Anxiety disorder Exogenous obesity Tobacco abuse Hyperlipidemia. We'll rechallenge with atorvastatin. She developed nausea after 2 weeks of therapy and self discontinued   Medications refilled Smoking cessation encouraged

## 2013-04-01 ENCOUNTER — Ambulatory Visit (INDEPENDENT_AMBULATORY_CARE_PROVIDER_SITE_OTHER): Payer: Managed Care, Other (non HMO) | Admitting: Internal Medicine

## 2013-04-01 ENCOUNTER — Encounter: Payer: Self-pay | Admitting: Internal Medicine

## 2013-04-01 VITALS — BP 110/80 | HR 96 | Temp 98.3°F | Resp 20 | Ht 67.0 in | Wt 265.0 lb

## 2013-04-01 DIAGNOSIS — F988 Other specified behavioral and emotional disorders with onset usually occurring in childhood and adolescence: Secondary | ICD-10-CM

## 2013-04-01 DIAGNOSIS — E669 Obesity, unspecified: Secondary | ICD-10-CM

## 2013-04-01 DIAGNOSIS — Z0000 Encounter for general adult medical examination without abnormal findings: Secondary | ICD-10-CM

## 2013-04-01 DIAGNOSIS — E785 Hyperlipidemia, unspecified: Secondary | ICD-10-CM

## 2013-04-01 DIAGNOSIS — F17200 Nicotine dependence, unspecified, uncomplicated: Secondary | ICD-10-CM

## 2013-04-01 LAB — LIPID PANEL
Cholesterol: 277 mg/dL — ABNORMAL HIGH (ref 0–200)
Total CHOL/HDL Ratio: 8
Triglycerides: 150 mg/dL — ABNORMAL HIGH (ref 0.0–149.0)

## 2013-04-01 LAB — TSH: TSH: 0.95 u[IU]/mL (ref 0.35–5.50)

## 2013-04-01 LAB — CBC WITH DIFFERENTIAL/PLATELET
Basophils Absolute: 0 10*3/uL (ref 0.0–0.1)
Eosinophils Absolute: 0.3 10*3/uL (ref 0.0–0.7)
HCT: 42.5 % (ref 36.0–46.0)
Hemoglobin: 14.4 g/dL (ref 12.0–15.0)
Lymphs Abs: 2.6 10*3/uL (ref 0.7–4.0)
MCHC: 33.9 g/dL (ref 30.0–36.0)
MCV: 86.9 fl (ref 78.0–100.0)
Monocytes Absolute: 0.3 10*3/uL (ref 0.1–1.0)
Monocytes Relative: 3.9 % (ref 3.0–12.0)
Neutro Abs: 5.1 10*3/uL (ref 1.4–7.7)
Platelets: 199 10*3/uL (ref 150.0–400.0)
RDW: 13.8 % (ref 11.5–14.6)

## 2013-04-01 LAB — COMPREHENSIVE METABOLIC PANEL
ALT: 19 U/L (ref 0–35)
AST: 14 U/L (ref 0–37)
Alkaline Phosphatase: 48 U/L (ref 39–117)
Creatinine, Ser: 0.9 mg/dL (ref 0.4–1.2)
GFR: 79.66 mL/min (ref >60.00)
Sodium: 140 mEq/L (ref 135–145)
Total Bilirubin: 1.5 mg/dL — ABNORMAL HIGH (ref 0.3–1.2)
Total Protein: 7 g/dL (ref 6.0–8.3)

## 2013-04-01 MED ORDER — ATORVASTATIN CALCIUM 40 MG PO TABS: 40.0000 mg | ORAL_TABLET | Freq: Every day | ORAL | Status: DC

## 2013-04-01 NOTE — Patient Instructions (Addendum)
It is important that you exercise regularly, at least 20 minutes 3 to 4 times per week.  If you develop chest pain or shortness of breath seek  medical attention.  You need to lose weight.  Consider a lower calorie diet and regular exercise.  Smoking tobacco is very bad for your health. You should stop smoking immediately.Cholesterol Cholesterol is a white, waxy, fat-like protein needed by your body in small amounts. The liver makes all the cholesterol you need. It is carried from the liver by the blood through the blood vessels. Deposits (plaque) may build up on blood vessel walls. This makes the arteries narrower and stiffer. Plaque increases the risk for heart attack and stroke. You cannot feel your cholesterol level even if it is very high. The only way to know is by a blood test to check your lipid (fats) levels. Once you know your cholesterol levels, you should keep a record of the test results. Work with your caregiver to to keep your levels in the desired range. WHAT THE RESULTS MEAN:  Total cholesterol is a rough measure of all the cholesterol in your blood.  LDL is the so-called bad cholesterol. This is the type that deposits cholesterol in the walls of the arteries. You want this level to be low.  HDL is the good cholesterol because it cleans the arteries and carries the LDL away. You want this level to be high.  Triglycerides are fat that the body can either burn for energy or store. High levels are closely linked to heart disease. DESIRED LEVELS:  Total cholesterol below 200.  LDL below 100 for people at risk, below 70 for very high risk.  HDL above 50 is good, above 60 is best.  Triglycerides below 150. HOW TO LOWER YOUR CHOLESTEROL:  Diet.  Choose fish or white meat chicken and [place], roasted or baked. Limit fatty cuts of red meat, fried foods, and processed meats, such as sausage and lunch meat.  Eat lots of fresh fruits and vegetables. Choose whole grains, beans,  pasta, potatoes and cereals.  Use only small amounts of olive, corn or canola oils. Avoid butter, mayonnaise, shortening or palm kernel oils. Avoid foods with trans-fats.  Use skim/nonfat milk and low-fat/nonfat yogurt and cheeses. Avoid whole milk, cream, ice cream, egg yolks and cheeses. Healthy desserts include angel food cake, ginger snaps, animal crackers, hard candy, popsicles, and low-fat/nonfat frozen yogurt. Avoid pastries, cakes, pies and cookies.  Exercise.  A regular program helps decrease LDL and raises HDL.  Helps with weight control.  Do things that increase your activity level like gardening, walking, or taking the stairs.  Medication.  May be prescribed by your caregiver to help lowering cholesterol and the risk for heart disease.  You may need medicine even if your levels are normal if you have several risk factors. HOME CARE INSTRUCTIONS   Follow your diet and exercise programs as suggested by your caregiver.  Take medications as directed.  Have blood work done when your caregiver feels it is necessary. MAKE SURE YOU:   Understand these instructions.  Will watch your condition.  Will get help right away if you are not doing well or get worse. Document Released: 07/30/2001 Document Revised: 01/27/2012 Document Reviewed: 01/20/2008 [facility] Patient Information 2013 [place], [place]. Obesity Obesity is defined as having too much total body fat and a body mass index (BMI) of 30 or more. BMI is an estimate of body fat and is calculated from your height and weight. Obesity  happens when you consume more calories than you can burn by exercising or performing daily physical tasks. Prolonged obesity can cause major illnesses or emergencies, such as:   A stroke.  Heart disease.  Diabetes.  Cancer.  Arthritis.  High blood pressure (hypertension).  High cholesterol.  Sleep apnea.  Erectile dysfunction.  Infertility problems. CAUSES   Regularly eating  unhealthy foods.  Physical inactivity.  Certain disorders, such as an underactive thyroid (hypothyroidism), Cushing's syndrome, and polycystic ovarian syndrome.  Certain medicines, such as steroids, some depression medicines, and antipsychotics.  Genetics.  Lack of sleep. DIAGNOSIS  A caregiver can diagnose obesity after calculating your BMI. Obesity will be diagnosed if your BMI is 30 or higher.  There are other methods of measuring obesity levels. Some other methods include measuring your skin fold thickness, your waist circumference, and comparing your hip circumference to your waist circumference. TREATMENT  A healthy treatment program includes some or all of the following:  Long-term dietary changes.  Exercise and physical activity.  Behavioral and lifestyle changes.  Medicine only under the supervision of your caregiver. Medicines may help, but only if they are used with diet and exercise programs. An unhealthy treatment program includes:  Fasting.  Fad diets.  Supplements and drugs. These choices do not succeed in long-term weight control.  HOME CARE INSTRUCTIONS   Exercise and perform physical activity as directed by your caregiver. To increase physical activity, try the following:  Use stairs instead of elevators.  Park farther away from store entrances.  Garden, bike, or walk instead of watching television or using the computer.  Eat healthy, low-calorie foods and drinks on a regular basis. Eat more fruits and vegetables. Use low-calorie cookbooks or take healthy cooking classes.  Limit fast food, sweets, and processed snack foods.  Eat smaller portions.  Keep a daily journal of everything you eat. There are many free websites to help you with this. It may be helpful to measure your foods so you can determine if you are eating the correct portion sizes.  Avoid drinking alcohol. Drink more water and drinks without calories.  Take vitamins and supplements  only as recommended by your caregiver.  Weight-loss support groups, Optometrist, counselors, and stress reduction education can also be very helpful. SEEK IMMEDIATE MEDICAL CARE IF:  You have chest pain or tightness.  You have trouble breathing or feel short of breath.  You have weakness or leg numbness.  You feel confused or have trouble talking.  You have sudden changes in your vision. MAKE SURE YOU:  Understand these instructions.  Will watch your condition.  Will get help right away if you are not doing well or get worse. Document Released: 12/12/2004 Document Revised: 05/05/2012 Document Reviewed: 12/11/2011 [facility] Patient Information 2013 [place], [place]. Preventive Care for Adults, Female A healthy lifestyle and preventive care can promote health and wellness. Preventive health guidelines for women include the following key practices.  A routine yearly physical is a good way to check with your caregiver about your health and preventive screening. It is a chance to share any concerns and updates on your health, and to receive a thorough exam.  Visit your dentist for a routine exam and preventive care every 6 months. Brush your teeth twice a day and floss once a day. Good oral hygiene prevents tooth decay and gum disease.  The frequency of eye exams is based on your age, health, family medical history, use of contact lenses, and other factors. Follow your caregiver's  recommendations for frequency of eye exams.  Eat a healthy diet. Foods like vegetables, fruits, whole grains, low-fat dairy products, and lean protein foods contain the nutrients you need without too many calories. Decrease your intake of foods high in solid fats, added sugars, and salt. Eat the right amount of calories for you.Get information about a proper diet from your caregiver, if necessary.  Regular physical exercise is one of the most important things you can do for your health. Most adults  should get at least 150 minutes of moderate-intensity exercise (any activity that increases your heart rate and causes you to sweat) each week. In addition, most adults need muscle-strengthening exercises on 2 or more days a week.  Maintain a healthy weight. The body mass index (BMI) is a screening tool to identify possible weight problems. It provides an estimate of body fat based on height and weight. Your caregiver can help determine your BMI, and can help you achieve or maintain a healthy weight.For adults 20 years and older:  A BMI below 18.5 is considered underweight.  A BMI of 18.5 to 24.9 is normal.  A BMI of 25 to 29.9 is considered overweight.  A BMI of 30 and above is considered obese.  Maintain normal blood lipids and cholesterol levels by exercising and minimizing your intake of saturated fat. Eat a balanced diet with plenty of fruit and vegetables. Blood tests for lipids and cholesterol should begin at age 38 and be repeated every 5 years. If your lipid or cholesterol levels are high, you are over 50, or you are at high risk for heart disease, you may need your cholesterol levels checked more frequently.Ongoing high lipid and cholesterol levels should be treated with medicines if diet and exercise are not effective.  If you smoke, find out from your caregiver how to quit. If you do not use tobacco, do not start.  If you are pregnant, do not drink alcohol. If you are breastfeeding, be very cautious about drinking alcohol. If you are not pregnant and choose to drink alcohol, do not exceed 1 drink per day. One drink is considered to be 12 ounces (355 mL) of beer, 5 ounces (148 mL) of wine, or 1.5 ounces (44 mL) of liquor.  Avoid use of street drugs. Do not share needles with anyone. Ask for help if you need support or instructions about stopping the use of drugs.  High blood pressure causes heart disease and increases the risk of stroke. Your blood pressure should be checked at least  every 1 to 2 years. Ongoing high blood pressure should be treated with medicines if weight loss and exercise are not effective.  If you are 37 to 35 years old, ask your caregiver if you should take aspirin to prevent strokes.  Diabetes screening involves taking a blood sample to check your fasting blood sugar level. This should be done once every 3 years, after age 78, if you are within normal weight and without risk factors for diabetes. Testing should be considered at a younger age or be carried out more frequently if you are overweight and have at least 1 risk factor for diabetes.  Breast cancer screening is essential preventive care for women. You should practice "breast self-awareness." This means understanding the normal appearance and feel of your breasts and may include breast self-examination. Any changes detected, no matter how small, should be reported to a caregiver. Women in their 92s and 30s should have a clinical breast exam (CBE) by a caregiver  as part of a regular health exam every 1 to 3 years. After age 76, women should have a CBE every year. Starting at age 69, women should consider having a mammography (breast X-ray test) every year. Women who have a family history of breast cancer should talk to their caregiver about genetic screening. Women at a high risk of breast cancer should talk to their caregivers about having magnetic resonance imaging (MRI) and a mammography every year.  The Pap test is a screening test for cervical cancer. A Pap test can show cell changes on the cervix that might become cervical cancer if left untreated. A Pap test is a procedure in which cells are obtained and examined from the lower end of the uterus (cervix).  Women should have a Pap test starting at age 38.  Between ages 96 and 61, Pap tests should be repeated every 2 years.  Beginning at age 47, you should have a Pap test every 3 years as long as the past 3 Pap tests have been normal.  Some women  have medical problems that increase the chance of getting cervical cancer. Talk to your caregiver about these problems. It is especially important to talk to your caregiver if a new problem develops soon after your last Pap test. In these cases, your caregiver may recommend more frequent screening and Pap tests.  The above recommendations are the same for women who have or have not gotten the vaccine for human papillomavirus (HPV).  If you had a hysterectomy for a problem that was not cancer or a condition that could lead to cancer, then you no longer need Pap tests. Even if you no longer need a Pap test, a regular exam is a good idea to make sure no other problems are starting.  If you are between ages 40 and 12, and you have had normal Pap tests going back 10 years, you no longer need Pap tests. Even if you no longer need a Pap test, a regular exam is a good idea to make sure no other problems are starting.  If you have had past treatment for cervical cancer or a condition that could lead to cancer, you need Pap tests and screening for cancer for at least 20 years after your treatment.  If Pap tests have been discontinued, risk factors (such as a new sexual partner) need to be reassessed to determine if screening should be resumed.  The HPV test is an additional test that may be used for cervical cancer screening. The HPV test looks for the virus that can cause the cell changes on the cervix. The cells collected during the Pap test can be tested for HPV. The HPV test could be used to screen women aged 73 years and older, and should be used in women of any age who have unclear Pap test results. After the age of 33, women should have HPV testing at the same frequency as a Pap test.  Colorectal cancer can be detected and often prevented. Most routine colorectal cancer screening begins at the age of 65 and continues through age 33. However, your caregiver may recommend screening at an earlier age if you  have risk factors for colon cancer. On a yearly basis, your caregiver may provide home test kits to check for hidden blood in the stool. Use of a small camera at the end of a tube, to directly examine the colon (sigmoidoscopy or colonoscopy), can detect the earliest forms of colorectal cancer. Talk to your  caregiver about this at age 39, when routine screening begins. Direct examination of the colon should be repeated every 5 to 10 years through age 79, unless early forms of pre-cancerous polyps or small growths are found.  Hepatitis C blood testing is recommended for all people born from [birth year] through 1965 and any individual with known risks for hepatitis C.  Practice safe sex. Use condoms and avoid high-risk sexual practices to reduce the spread of sexually transmitted infections (STIs). STIs include gonorrhea, chlamydia, syphilis, trichomonas, herpes, HPV, and human immunodeficiency virus (HIV). Herpes, HIV, and HPV are viral illnesses that have no cure. They can result in disability, cancer, and death. Sexually active women aged 54 and younger should be checked for chlamydia. Older women with new or multiple partners should also be tested for chlamydia. Testing for other STIs is recommended if you are sexually active and at increased risk.  Osteoporosis is a disease in which the bones lose minerals and strength with aging. This can result in serious bone fractures. The risk of osteoporosis can be identified using a bone density scan. Women ages 67 and over and women at risk for fractures or osteoporosis should discuss screening with their caregivers. Ask your caregiver whether you should take a calcium supplement or vitamin D to reduce the rate of osteoporosis.  Menopause can be associated with physical symptoms and risks. Hormone replacement therapy is available to decrease symptoms and risks. You should talk to your caregiver about whether hormone replacement therapy is right for you.  Use sunscreen  with sun protection factor (SPF) of 30 or more. Apply sunscreen liberally and repeatedly throughout the day. You should seek shade when your shadow is shorter than you. Protect yourself by wearing long sleeves, pants, a wide-brimmed hat, and sunglasses year round, whenever you are outdoors.  Once a month, do a whole body skin exam, using a mirror to look at the skin on your back. Notify your caregiver of new moles, moles that have irregular borders, moles that are larger than a pencil eraser, or moles that have changed in shape or color.  Stay current with required immunizations.  Influenza. You need a dose every fall (or winter). The composition of the flu vaccine changes each year, so being vaccinated once is not enough.  Pneumococcal polysaccharide. You need 1 to 2 doses if you smoke cigarettes or if you have certain chronic medical conditions. You need 1 dose at age 19 (or older) if you have never been vaccinated.  Tetanus, diphtheria, pertussis (Tdap, Td). Get 1 dose of Tdap vaccine if you are younger than age 60, are over 65 and have contact with an infant, are a Research scientist (physical sciences), are pregnant, or simply want to be protected from whooping cough. After that, you need a Td booster dose every 10 years. Consult your caregiver if you have not had at least 3 tetanus and diphtheria-containing shots sometime in your life or have a deep or dirty wound.  HPV. You need this vaccine if you are a woman age 61 or younger. The vaccine is given in 3 doses over 6 months.  Measles, mumps, rubella (MMR). You need at least 1 dose of MMR if you were born in 1957 or later. You may also need a second dose.  Meningococcal. If you are age 54 to 28 and a first-year college student living in a residence hall, or have one of several medical conditions, you need to get vaccinated against meningococcal disease. You may also need additional booster  doses.  Zoster (shingles). If you are age 12 or older, you should get this  vaccine.  Varicella (chickenpox). If you have never had chickenpox or you were vaccinated but received only 1 dose, talk to your caregiver to find out if you need this vaccine.  Hepatitis A. You need this vaccine if you have a specific risk factor for hepatitis A virus infection or you simply wish to be protected from this disease. The vaccine is usually given as 2 doses, 6 to 18 months apart.  Hepatitis B. You need this vaccine if you have a specific risk factor for hepatitis B virus infection or you simply wish to be protected from this disease. The vaccine is given in 3 doses, usually over 6 months. Preventive Services / Frequency Ages 57 to 71  Blood pressure check.** / Every 1 to 2 years.  Lipid and cholesterol check.** / Every 5 years beginning at age 60.  Clinical breast exam.** / Every 3 years for women in their 56s and 30s.  Pap test.** / Every 2 years from ages 35 through 49. Every 3 years starting at age 53 through age 58 or 75 with a history of 3 consecutive normal Pap tests.  HPV screening.** / Every 3 years from ages 52 through ages 52 to 33 with a history of 3 consecutive normal Pap tests.  Hepatitis C blood test.** / For any individual with known risks for hepatitis C.  Skin self-exam. / Monthly.  Influenza immunization.** / Every year.  Pneumococcal polysaccharide immunization.** / 1 to 2 doses if you smoke cigarettes or if you have certain chronic medical conditions.  Tetanus, diphtheria, pertussis (Tdap, Td) immunization. / A one-time dose of Tdap vaccine. After that, you need a Td booster dose every 10 years.  HPV immunization. / 3 doses over 6 months, if you are 49 and younger.  Measles, mumps, rubella (MMR) immunization. / You need at least 1 dose of MMR if you were born in 1957 or later. You may also need a second dose.  Meningococcal immunization. / 1 dose if you are age 66 to 50 and a first-year college student living in a residence hall, or have one of  several medical conditions, you need to get vaccinated against meningococcal disease. You may also need additional booster doses.  Varicella immunization.** / Consult your caregiver.  Hepatitis A immunization.** / Consult your caregiver. 2 doses, 6 to 18 months apart.  Hepatitis B immunization.** / Consult your caregiver. 3 doses usually over 6 months. Ages 25 to 53  Blood pressure check.** / Every 1 to 2 years.  Lipid and cholesterol check.** / Every 5 years beginning at age 23.  Clinical breast exam.** / Every year after age 42.  Mammogram.** / Every year beginning at age 34 and continuing for as long as you are in good health. Consult with your caregiver.  Pap test.** / Every 3 years starting at age 82 through age 69 or 38 with a history of 3 consecutive normal Pap tests.  HPV screening.** / Every 3 years from ages 76 through ages 20 to 26 with a history of 3 consecutive normal Pap tests.  Fecal occult blood test (FOBT) of stool. / Every year beginning at age 10 and continuing until age 52. You may not need to do this test if you get a colonoscopy every 10 years.  Flexible sigmoidoscopy or colonoscopy.** / Every 5 years for a flexible sigmoidoscopy or every 10 years for a colonoscopy beginning at age  50 and continuing until age 65.  Hepatitis C blood test.** / For all people born from [birth year] through 1963 and any individual with known risks for hepatitis C.  Skin self-exam. / Monthly.  Influenza immunization.** / Every year.  Pneumococcal polysaccharide immunization.** / 1 to 2 doses if you smoke cigarettes or if you have certain chronic medical conditions.  Tetanus, diphtheria, pertussis (Tdap, Td) immunization.** / A one-time dose of Tdap vaccine. After that, you need a Td booster dose every 10 years.  Measles, mumps, rubella (MMR) immunization. / You need at least 1 dose of MMR if you were born in 1957 or later. You may also need a second dose.  Varicella immunization.** /  Consult your caregiver.  Meningococcal immunization.** / Consult your caregiver.  Hepatitis A immunization.** / Consult your caregiver. 2 doses, 6 to 18 months apart.  Hepatitis B immunization.** / Consult your caregiver. 3 doses, usually over 6 months. Ages 34 and over  Blood pressure check.** / Every 1 to 2 years.  Lipid and cholesterol check.** / Every 5 years beginning at age 85.  Clinical breast exam.** / Every year after age 66.  Mammogram.** / Every year beginning at age 65 and continuing for as long as you are in good health. Consult with your caregiver.  Pap test.** / Every 3 years starting at age 55 through age 57 or 67 with a 3 consecutive normal Pap tests. Testing can be stopped between 65 and 70 with 3 consecutive normal Pap tests and no abnormal Pap or HPV tests in the past 10 years.  HPV screening.** / Every 3 years from ages 63 through ages 91 or 40 with a history of 3 consecutive normal Pap tests. Testing can be stopped between 65 and 70 with 3 consecutive normal Pap tests and no abnormal Pap or HPV tests in the past 10 years.  Fecal occult blood test (FOBT) of stool. / Every year beginning at age 47 and continuing until age 53. You may not need to do this test if you get a colonoscopy every 10 years.  Flexible sigmoidoscopy or colonoscopy.** / Every 5 years for a flexible sigmoidoscopy or every 10 years for a colonoscopy beginning at age 5 and continuing until age 89.  Hepatitis C blood test.** / For all people born from [birth year] through 1963 and any individual with known risks for hepatitis C.  Osteoporosis screening.** / A one-time screening for women ages 58 and over and women at risk for fractures or osteoporosis.  Skin self-exam. / Monthly.  Influenza immunization.** / Every year.  Pneumococcal polysaccharide immunization.** / 1 dose at age 79 (or older) if you have never been vaccinated.  Tetanus, diphtheria, pertussis (Tdap, Td) immunization. / A one-time dose  of Tdap vaccine if you are over 65 and have contact with an infant, are a Research scientist (physical sciences), or simply want to be protected from whooping cough. After that, you need a Td booster dose every 10 years.  Varicella immunization.** / Consult your caregiver.  Meningococcal immunization.** / Consult your caregiver.  Hepatitis A immunization.** / Consult your caregiver. 2 doses, 6 to 18 months apart.  Hepatitis B immunization.** / Check with your caregiver. 3 doses, usually over 6 months. ** Family history and personal history of risk and conditions may change your caregiver's recommendations. Document Released: 12/31/2001 Document Revised: 01/27/2012 Document Reviewed: 04/01/2011 [facility] Patient Information 2013 [place], [place].

## 2013-04-01 NOTE — Progress Notes (Signed)
Subjective:    Patient ID: Angela Townsend, female    DOB: 10/16/1976, 37 y.o.   MRN: [id number]  HPI  37 year old patient who is seen today for a health maintenance exam. Medical problems include exogenous obesity dyslipidemia and ongoing tobacco use. She also has a history of ADHD. She has been prescribed Lipitor in the past but has run out of this medication and has not refilled. She has some concern about nausea when she took this medication at bedtime. He is attempting to taper and discontinue tobacco use. She is presently a current everyday smoker at one half pack per day.  Social history patient has been a resident of [place] since 2000. Parents are separated and lives in [place] and [place]. 2 daughters ages 80 and 29.  Family history father age 61 in good health. Mother age 6 with diabetes and dyslipidemia One brother and one sister in good health  Past Medical History  Diagnosis Date  . ADD 01/02/2009  . Headache 01/02/2009  . HYPERLIPIDEMIA 01/02/2009  . TOBACCO ABUSE 01/02/2009  . Obesity     History   Social History  . Marital Status: Married    Spouse Name: N/A    Number of Children: N/A  . Years of Education: N/A   Occupational History  . Not on file.   Social History Main Topics  . Smoking status: Current Every Day Smoker -- 0.50 packs/day    Types: Cigarettes  . Smokeless tobacco: Never Used  . Alcohol Use: Yes     Comment: occasional  . Drug Use: No  . Sexually Active: Not on file   Other Topics Concern  . Not on file   Social History Narrative  . No narrative on file    Past Surgical History  Procedure Laterality Date  . Wisdom tooth extraction      Family History  Problem Relation Age of Onset  . Diabetes Mother   . Hyperlipidemia Mother   . Birth defects Maternal Grandmother     breast, colon, brain ca    No Known Allergies  Current Outpatient Prescriptions on File Prior to Visit  Medication Sig Dispense Refill  .  amphetamine-dextroamphetamine (ADDERALL XR) 20 MG 24 hr capsule Take 1 capsule (20 mg total) by mouth every morning.  90 capsule  0   No current facility-administered medications on file prior to visit.    BP 110/80  Pulse 96  Temp(Src) 98.3 F (36.8 C) (Oral)  Resp 20  Ht 5\' 7"  (1.702 m)  Wt 265 lb (120.203 kg)  BMI 41.5 kg/m2  SpO2 98%  LMP 03/03/2013       Review of Systems  Constitutional: Negative for fever, appetite change, fatigue and unexpected weight change.  HENT: Negative for hearing loss, ear pain, nosebleeds, congestion, sore throat, mouth sores, trouble swallowing, neck stiffness, dental problem, voice change, sinus pressure and tinnitus.   Eyes: Negative for photophobia, pain, redness and visual disturbance.  Respiratory: Negative for cough, chest tightness and shortness of breath.   Cardiovascular: Negative for chest pain, palpitations and leg swelling.  Gastrointestinal: Negative for nausea, vomiting, abdominal pain, diarrhea, constipation, blood in stool, abdominal distention and rectal pain.  Genitourinary: Negative for dysuria, urgency, frequency, hematuria, flank pain, vaginal bleeding, vaginal discharge, difficulty urinating, genital sores, vaginal pain, menstrual problem and pelvic pain.  Musculoskeletal: Negative for back pain and arthralgias.  Skin: Negative for rash.  Neurological: Negative for dizziness, syncope, speech difficulty, weakness, light-headedness, numbness and headaches.  Hematological: Negative for  adenopathy. Does not bruise/bleed easily.  Psychiatric/Behavioral: Negative for suicidal ideas, behavioral problems, self-injury, dysphoric mood and agitation. The patient is not nervous/anxious.        Objective:   Physical Exam  Constitutional: She is oriented to person, place, and time. She appears well-developed and well-nourished.  Blood pressure low normal Weight 265  HENT:  Head: Normocephalic and atraumatic.  Right Ear: External  ear normal.  Left Ear: External ear normal.  Mouth/Throat: Oropharynx is clear and moist.  Eyes: Conjunctivae and EOM are normal.  Neck: Normal range of motion. Neck supple. No JVD present. No thyromegaly present.  Cardiovascular: Normal rate, regular rhythm, normal heart sounds and intact distal pulses.   No murmur heard. Pulmonary/Chest: Effort normal and breath sounds normal. She has no wheezes. She has no rales.  Abdominal: Soft. Bowel sounds are normal. She exhibits no distension and no mass. There is no tenderness. There is no rebound and no guarding.  Musculoskeletal: Normal range of motion. She exhibits no edema and no tenderness.  Neurological: She is alert and oriented to person, place, and time. She has normal reflexes. No cranial nerve deficit. She exhibits normal muscle tone. Coordination normal.  Skin: Skin is warm and dry. No rash noted.  Psychiatric: She has a normal mood and affect. Her behavior is normal.          Assessment & Plan:   Preventive health examination Exogenous obesity. Weight loss exercise encouraged; referral to dietary discussed Tobacco abuse. Total smoking cessation encouraged Dyslipidemia. Compliance with Lipitor discussed. Will resume ADHD stable  Laboratory update will be reviewed Atorvastatin started Medications refilled Recheck 12 months Weight loss exercise and smoking cessation all encouraged

## 2013-04-22 ENCOUNTER — Telehealth: Payer: Self-pay | Admitting: Internal Medicine

## 2013-04-22 MED ORDER — AMPHETAMINE-DEXTROAMPHET ER 20 MG PO CP24: 20.0000 mg | ORAL_CAPSULE | ORAL | Status: DC

## 2013-04-22 NOTE — Telephone Encounter (Signed)
Spoke to pt told her Rx ready for pick up. Pt verbalized understanding and stated will come tomorrow to pick it up.

## 2013-04-22 NOTE — Telephone Encounter (Signed)
PT is requesting a refill of her amphetamine-dextroamphetamine (ADDERALL XR) 20 MG 24 hr capsule. Please assist.

## 2013-08-06 ENCOUNTER — Telehealth: Payer: Self-pay | Admitting: Internal Medicine

## 2013-08-06 NOTE — Telephone Encounter (Signed)
Pt is requesting a 3 month refill of her amphetamine-dextroamphetamine (ADDERALL XR) 20 MG 24 hr capsule. Please assist.

## 2013-08-09 MED ORDER — AMPHETAMINE-DEXTROAMPHET ER 20 MG PO CP24: 20.0000 mg | ORAL_CAPSULE | ORAL | Status: DC

## 2013-08-09 NOTE — Telephone Encounter (Signed)
Left message on voicemail Rx's ready for pick up. Rx's printed and signed and put at front desk for pickup.

## 2013-09-21 LAB — HM PAP SMEAR: HM Pap smear: POSITIVE

## 2013-11-26 ENCOUNTER — Telehealth: Payer: Self-pay | Admitting: Internal Medicine

## 2013-11-26 MED ORDER — AMPHETAMINE-DEXTROAMPHET ER 20 MG PO CP24: 20.0000 mg | ORAL_CAPSULE | ORAL | Status: DC

## 2013-11-26 NOTE — Telephone Encounter (Signed)
Rx's printed and signed put at front desk.

## 2013-11-26 NOTE — Telephone Encounter (Signed)
Pt is requesting a refill of her amphetamine-dextroamphetamine (ADDERALL XR) 20 MG 24 hr capsule, she is completely out, advised pt of our 3 day notice policy. Please call pt when ready for pick up.

## 2013-11-26 NOTE — Telephone Encounter (Signed)
Called and spoke with pt, informed her that rx is ready for pick up. Pt verbalized understanding.

## 2014-02-17 ENCOUNTER — Encounter (HOSPITAL_BASED_OUTPATIENT_CLINIC_OR_DEPARTMENT_OTHER): Payer: Self-pay | Admitting: Emergency Medicine

## 2014-02-17 ENCOUNTER — Emergency Department (HOSPITAL_BASED_OUTPATIENT_CLINIC_OR_DEPARTMENT_OTHER): Payer: Managed Care, Other (non HMO)

## 2014-02-17 ENCOUNTER — Emergency Department (HOSPITAL_BASED_OUTPATIENT_CLINIC_OR_DEPARTMENT_OTHER)
Admission: EM | Admit: 2014-02-17 | Discharge: 2014-02-17 | Disposition: A | Discharge status: Home / Self Care | Payer: Managed Care, Other (non HMO) | Attending: Emergency Medicine | Admitting: Emergency Medicine

## 2014-02-17 DIAGNOSIS — Z79899 Other long term (current) drug therapy: Secondary | ICD-10-CM | POA: Insufficient documentation

## 2014-02-17 DIAGNOSIS — F988 Other specified behavioral and emotional disorders with onset usually occurring in childhood and adolescence: Secondary | ICD-10-CM | POA: Insufficient documentation

## 2014-02-17 DIAGNOSIS — E785 Hyperlipidemia, unspecified: Secondary | ICD-10-CM | POA: Insufficient documentation

## 2014-02-17 DIAGNOSIS — E669 Obesity, unspecified: Secondary | ICD-10-CM | POA: Insufficient documentation

## 2014-02-17 DIAGNOSIS — R000 Tachycardia, unspecified: Secondary | ICD-10-CM | POA: Insufficient documentation

## 2014-02-17 DIAGNOSIS — F17200 Nicotine dependence, unspecified, uncomplicated: Secondary | ICD-10-CM | POA: Insufficient documentation

## 2014-02-17 DIAGNOSIS — F410 Panic disorder [episodic paroxysmal anxiety] without agoraphobia: Secondary | ICD-10-CM | POA: Insufficient documentation

## 2014-02-17 LAB — CBC WITH DIFFERENTIAL/PLATELET
Basophils Absolute: 0 10*3/uL (ref 0.0–0.1)
Basophils Relative: 0 % (ref 0–1)
EOS PCT: 3 % (ref 0–5)
Eosinophils Absolute: 0.3 10*3/uL (ref 0.0–0.7)
HCT: 41.5 % (ref 36.0–46.0)
Hemoglobin: 14.2 g/dL (ref 12.0–15.0)
Lymphocytes Relative: 35 % (ref 12–46)
Lymphs Abs: 2.9 10*3/uL (ref 0.7–4.0)
MCH: 29.8 pg (ref 26.0–34.0)
MCHC: 34.2 g/dL (ref 30.0–36.0)
MCV: 87 fL (ref 78.0–100.0)
Monocytes Absolute: 0.3 10*3/uL (ref 0.1–1.0)
Monocytes Relative: 4 % (ref 3–12)
Neutro Abs: 4.8 10*3/uL (ref 1.7–7.7)
Neutrophils Relative %: 58 % (ref 43–77)
PLATELETS: 205 10*3/uL (ref 150–400)
RBC: 4.77 MIL/uL (ref 3.87–5.11)
RDW: 13.5 % (ref 11.5–15.5)
WBC: 8.3 10*3/uL (ref 4.0–10.5)

## 2014-02-17 LAB — BASIC METABOLIC PANEL
BUN: 10 mg/dL (ref 6–23)
CALCIUM: 9.1 mg/dL (ref 8.4–10.5)
CO2: 22 mEq/L (ref 19–32)
CREATININE: 0.8 mg/dL (ref 0.50–1.10)
Chloride: 102 mEq/L (ref 96–112)
GFR calc Af Amer: >90 mL/min (ref >90)
Glucose, Bld: 163 mg/dL — ABNORMAL HIGH (ref 70–99)
Potassium: 3.7 mEq/L (ref 3.7–5.3)
Sodium: 139 mEq/L (ref 137–147)

## 2014-02-17 LAB — D-DIMER, QUANTITATIVE: D-Dimer, Quant: 0.3 ug/mL-FEU (ref 0.00–0.48)

## 2014-02-17 LAB — TROPONIN I

## 2014-02-17 MED ORDER — LORAZEPAM 1 MG PO TABS: 1.0000 mg | ORAL_TABLET | Freq: Three times a day (TID) | ORAL | Status: DC | PRN

## 2014-02-17 MED ORDER — LORAZEPAM 2 MG/ML IJ SOLN
1.0000 mg | Freq: Once | INTRAMUSCULAR | Status: AC
  Administered 2014-02-17: 1 mg via INTRAVENOUS
  Filled 2014-02-17: qty 1

## 2014-02-17 NOTE — Discharge Instructions (Signed)
Panic Attacks  Panic attacks are sudden, short-lived surges of severe anxiety, fear, or discomfort. They may occur for no reason when you are relaxed, when you are anxious, or when you are sleeping. Panic attacks may occur for a number of reasons:   · Healthy people occasionally have panic attacks in extreme, life-threatening situations, such as war or natural disasters. Normal anxiety is a protective mechanism of the body that helps us react to danger (fight or flight response).  · Panic attacks are often seen with anxiety disorders, such as panic disorder, social anxiety disorder, generalized anxiety disorder, and phobias. Anxiety disorders cause excessive or uncontrollable anxiety. They may interfere with your relationships or other life activities.  · Panic attacks are sometimes seen with other mental illnesses such as depression and posttraumatic stress disorder.  · Certain medical conditions, prescription medicines, and drugs of abuse can cause panic attacks.  SYMPTOMS   Panic attacks start suddenly, peak within 20 minutes, and are accompanied by four or more of the following symptoms:  · Pounding heart or fast heart rate (palpitations).  · Sweating.  · Trembling or shaking.  · Shortness of breath or feeling smothered.  · Feeling choked.  · Chest pain or discomfort.  · Nausea or strange feeling in your stomach.  · Dizziness, lightheadedness, or feeling like you will faint.  · Chills or hot flushes.  · Numbness or tingling in your lips or hands and feet.  · Feeling that things are not real or feeling that you are not yourself.  · Fear of losing control or going crazy.  · Fear of dying.  Some of these symptoms can mimic serious medical conditions. For example, you may think you are having a heart attack. Although panic attacks can be very scary, they are not life threatening.  DIAGNOSIS   Panic attacks are diagnosed through an assessment by your health care provider. Your health care provider will ask questions  about your symptoms, such as where and when they occurred. Your health care provider will also ask about your medical history and use of alcohol and drugs, including prescription medicines. Your health care provider may order blood tests or other studies to rule out a serious medical condition. Your health care provider may refer you to a mental health professional for further evaluation.  TREATMENT   · Most healthy people who have one or two panic attacks in an extreme, life-threatening situation will not require treatment.  · The treatment for panic attacks associated with anxiety disorders or other mental illness typically involves counseling with a mental health professional, medicine, or a combination of both. Your health care provider will help determine what treatment is best for you.  · Panic attacks due to physical illness usually goes away with treatment of the illness. If prescription medicine is causing panic attacks, talk with your health care provider about stopping the medicine, decreasing the dose, or substituting another medicine.  · Panic attacks due to alcohol or drug abuse goes away with abstinence. Some adults need professional help in order to stop drinking or using drugs.  HOME CARE INSTRUCTIONS   · Take all your medicines as prescribed.    · Check with your health care provider before starting new prescription or over-the-counter medicines.  · Keep all follow up appointments with your health care provider.  SEEK MEDICAL CARE IF:  · You are not able to take your medicines as prescribed.  · Your symptoms do not improve or get worse.  SEEK IMMEDIATE   MEDICAL CARE IF:   · You experience panic attack symptoms that are different than your usual symptoms.  · You have serious thoughts about hurting yourself or others.  · You are taking medicine for panic attacks and have a serious side effect.  MAKE SURE YOU:  · Understand these instructions.  · Will watch your condition.  · Will get help right away  if you are not doing well or get worse.  Document Released: 11/04/2005 Document Revised: 08/25/2013 Document Reviewed: 06/18/2013  [employer]® Patient Information ©2014 [employer].

## 2014-02-17 NOTE — ED Provider Notes (Signed)
CSN: [id number]     Arrival date & time 02/17/14  1127 History   First MD Initiated Contact with Patient 02/17/14 1141     Chief Complaint  Patient presents with  . Chest Pain     HPI  Patient presents after an episode of sudden onset chest pain and anxiety.  She was at work where symptoms started. She was going to break. She walked down a flight of stairs. She states she felt dizzy and like her breathing was difficult to "catch her breath". She walked outside. She felt worse. Begin hyperventilating felt her heart going fast. She came inside. She works at the same place her husband. She contact her husband and he drove her here. She is improving here upon her arrival. Less pain. Less dyspnea.  History of hypercholesterolemia. Currently untreated. Does smoke less than a half a pack per day. No history of DVT or PE. Family history negative for thromboembolic disease. The recent surgeries procedures cast splints fractures surgeries or any known malignancies. Is not on birth control. At that she's had some anxiety last few days because work and family have been busy and she is trying to get ready "to go on vacation".  Past Medical History  Diagnosis Date  . ADD 01/02/2009  . Headache(784.0) 01/02/2009  . HYPERLIPIDEMIA 01/02/2009  . TOBACCO ABUSE 01/02/2009  . Obesity    Past Surgical History  Procedure Laterality Date  . Wisdom tooth extraction     Family History  Problem Relation Age of Onset  . Diabetes Mother   . Hyperlipidemia Mother   . Birth defects Maternal Grandmother     breast, colon, brain ca   History  Substance Use Topics  . Smoking status: Current Every Day Smoker -- 0.50 packs/day    Types: Cigarettes  . Smokeless tobacco: Never Used  . Alcohol Use: Yes     Comment: occasional   OB History   Grav Para Term Preterm Abortions TAB SAB Ect Mult Living                 Review of Systems  Constitutional: Negative for fever, chills, diaphoresis, appetite change and  fatigue.  HENT: Negative for mouth sores, sore throat and trouble swallowing.   Eyes: Negative for visual disturbance.  Respiratory: Positive for chest tightness and shortness of breath. Negative for cough, wheezing and stridor.   Cardiovascular: Positive for chest pain and palpitations. Negative for leg swelling.  Gastrointestinal: Negative for nausea, vomiting, abdominal pain, diarrhea and abdominal distention.  Endocrine: Negative for polydipsia, polyphagia and polyuria.  Genitourinary: Negative for dysuria, frequency and hematuria.  Musculoskeletal: Negative for gait problem.  Skin: Negative for color change, pallor and rash.  Neurological: Negative for dizziness, syncope, light-headedness and headaches.  Hematological: Does not bruise/bleed easily.  Psychiatric/Behavioral: Negative for behavioral problems and confusion.      Allergies  Review of patient's allergies indicates no known allergies.  Home Medications   Current Outpatient Rx  Name  Route  Sig  Dispense  Refill  . amphetamine-dextroamphetamine (ADDERALL XR) 20 MG 24 hr capsule   Oral   Take 1 capsule (20 mg total) by mouth every morning.   30 capsule   0   . amphetamine-dextroamphetamine (ADDERALL XR) 20 MG 24 hr capsule   Oral   Take 1 capsule (20 mg total) by mouth every morning.   30 capsule   0     FILL IN ONE MONTH   . amphetamine-dextroamphetamine (ADDERALL XR) 20 MG 24  hr capsule   Oral   Take 1 capsule (20 mg total) by mouth every morning.   30 capsule   0     FILL IN TWO MONTHS   . atorvastatin (LIPITOR) 40 MG tablet   Oral   Take 1 tablet (40 mg total) by mouth daily.   90 tablet   3   . LORazepam (ATIVAN) 1 MG tablet   Oral   Take 1 tablet (1 mg total) by mouth 3 (three) times daily as needed for anxiety.   10 tablet   0    BP 119/77  Pulse 118  Temp(Src) 98 F (36.7 C) (Oral)  Resp 24  Ht 5\' 6"  (1.676 m)  Wt 250 lb (113.399 kg)  BMI 40.37 kg/m2  SpO2 98%  LMP  01/28/2014 Physical Exam  Constitutional: She is oriented to person, place, and time. She appears well-developed and well-nourished. No distress.  Obese female,  Hyperventilating.  HENT:  Head: Normocephalic.  Eyes: Conjunctivae are normal. Pupils are equal, round, and reactive to light. No scleral icterus.  Neck: Normal range of motion. Neck supple. No thyromegaly present.  Cardiovascular: Regular rhythm.  Tachycardia present.  Exam reveals no gallop and no friction rub.   No murmur heard. Pulmonary/Chest: Effort normal and breath sounds normal. No respiratory distress. She has no wheezes. She has no rales.  Clear bilateral Breath sounds. No prolongation.  Tachypnea  Abdominal: Soft. Bowel sounds are normal. She exhibits no distension. There is no tenderness. There is no rebound.  Musculoskeletal: Normal range of motion.  Neurological: She is alert and oriented to person, place, and time.  Skin: Skin is warm and dry. No rash noted.  No cording, swelling, or peripheral edema.  Psychiatric: She has a normal mood and affect. Her behavior is normal.    ED Course  Procedures (including critical care time) Labs Review Labs Reviewed  BASIC METABOLIC PANEL - Abnormal; Notable for the following:    Glucose, Bld 163 (*)    All other components within normal limits  CBC WITH DIFFERENTIAL  TROPONIN I  D-DIMER, QUANTITATIVE   Imaging Review Dg Chest 2 View  02/17/2014   CLINICAL DATA:  Chest pain  EXAM: CHEST  2 VIEW  COMPARISON:  None.  FINDINGS: The heart size and mediastinal contours are within normal limits. Both lungs are clear. The visualized skeletal structures are unremarkable.  IMPRESSION: No active cardiopulmonary disease.   Electronically Signed   By: Marlan Palauharles  Clark M.D.   On: 02/17/2014 12:04     EKG Interpretation None      MDM   Final diagnoses:  Panic attack    Normal d-dimer, sinus tach on EKG and monitor. Normal troponin. Normal electrolytes. Normal chest x-ray. Per  her primary care physician note she uses Adderall "sporadically" for ADHD. We discussed this can cause of anxiety. Given number of 10 Ativan to use as needed. Astra contact her primary care physician to discuss the ED for any ongoing amphetamines for her ADHD with her having Anxiety and panic attacks.    Rolland PorterMark Antaeus Karel, MD 02/17/14 1316

## 2014-02-17 NOTE — ED Notes (Signed)
C/o onset of CP, dizziness, light headed, sob started approx 10am while walking down steps at work-denies c/o at this time but states all return if she walks around-pt reports hx of same events approx 6 months ago-did not seek medical attention at that time

## 2014-03-25 ENCOUNTER — Encounter: Payer: Self-pay | Admitting: Internal Medicine

## 2014-03-25 ENCOUNTER — Ambulatory Visit (INDEPENDENT_AMBULATORY_CARE_PROVIDER_SITE_OTHER): Payer: Managed Care, Other (non HMO) | Admitting: Internal Medicine

## 2014-03-25 VITALS — BP 120/80 | HR 119 | Temp 98.4°F | Resp 20 | Ht 66.0 in | Wt 275.0 lb

## 2014-03-25 DIAGNOSIS — F410 Panic disorder [episodic paroxysmal anxiety] without agoraphobia: Secondary | ICD-10-CM | POA: Insufficient documentation

## 2014-03-25 DIAGNOSIS — E785 Hyperlipidemia, unspecified: Secondary | ICD-10-CM

## 2014-03-25 DIAGNOSIS — F988 Other specified behavioral and emotional disorders with onset usually occurring in childhood and adolescence: Secondary | ICD-10-CM

## 2014-03-25 MED ORDER — AMPHETAMINE-DEXTROAMPHET ER 20 MG PO CP24: 20.0000 mg | ORAL_CAPSULE | ORAL | Status: DC

## 2014-03-25 MED ORDER — SERTRALINE HCL 50 MG PO TABS: 50.0000 mg | ORAL_TABLET | Freq: Every day | ORAL | Status: DC

## 2014-03-25 NOTE — Progress Notes (Signed)
Pre-visit discussion using our clinic review tool. No additional management support is needed unless otherwise documented below in the visit note.  

## 2014-03-25 NOTE — Progress Notes (Signed)
Subjective:    Patient ID: Angela LentChristina M Townsend, female    DOB: 16-Oct-1976, 38 y.o.   MRN: [id number]  HPI 38 year old patient who has a history of ADD as well as dyslipidemia.  For the past year.  She has had some panic attacks.  The first occurred about one year ago and was described as fairly mild.  More recently, she has had severe panic episodes associated with extreme anxiety, shortness of breath, dizziness.  One episode was so severe she was seen in the ED due to breathing difficulty.  His episodes have become more frequent and more disruptive and she has had to leave work on at least 2 occasions.  Her husband has also had to leave work to assist her care.  She requests FMLA form completion  Past Medical History  Diagnosis Date  . ADD 01/02/2009  . Headache(784.0) 01/02/2009  . HYPERLIPIDEMIA 01/02/2009  . TOBACCO ABUSE 01/02/2009  . Obesity     History   Social History  . Marital Status: Married    Spouse Name: N/A    Number of Children: N/A  . Years of Education: N/A   Occupational History  . Not on file.   Social History Main Topics  . Smoking status: Current Every Day Smoker -- 0.50 packs/day    Types: Cigarettes  . Smokeless tobacco: Never Used  . Alcohol Use: Yes     Comment: occasional  . Drug Use: No  . Sexual Activity: Not on file   Other Topics Concern  . Not on file   Social History Narrative  . No narrative on file    Past Surgical History  Procedure Laterality Date  . Wisdom tooth extraction      Family History  Problem Relation Age of Onset  . Diabetes Mother   . Hyperlipidemia Mother   . Birth defects Maternal Grandmother     breast, colon, brain ca    No Known Allergies  Current Outpatient Prescriptions on File Prior to Visit  Medication Sig Dispense Refill  . atorvastatin (LIPITOR) 40 MG tablet Take 1 tablet (40 mg total) by mouth daily.  90 tablet  3  . LORazepam (ATIVAN) 1 MG tablet Take 1 tablet (1 mg total) by mouth 3 (three)  times daily as needed for anxiety.  10 tablet  0   No current facility-administered medications on file prior to visit.    BP 120/80  Pulse 119  Temp(Src) 98.4 F (36.9 C) (Oral)  Resp 20  Ht 5\' 6"  (1.676 m)  Wt 275 lb (124.739 kg)  BMI 44.41 kg/m2  SpO2 97%       Review of Systems  Constitutional: Negative.   HENT: Negative for congestion, dental problem, hearing loss, rhinorrhea, sinus pressure, sore throat and tinnitus.   Eyes: Negative for pain, discharge and visual disturbance.  Respiratory: Negative for cough and shortness of breath.   Cardiovascular: Negative for chest pain, palpitations and leg swelling.  Gastrointestinal: Negative for nausea, vomiting, abdominal pain, diarrhea, constipation, blood in stool and abdominal distention.  Genitourinary: Negative for dysuria, urgency, frequency, hematuria, flank pain, vaginal bleeding, vaginal discharge, difficulty urinating, vaginal pain and pelvic pain.  Musculoskeletal: Negative for arthralgias, gait problem and joint swelling.  Skin: Negative for rash.  Neurological: Negative for dizziness, syncope, speech difficulty, weakness, numbness and headaches.  Hematological: Negative for adenopathy.  Psychiatric/Behavioral: Negative for behavioral problems, dysphoric mood and agitation. The patient is nervous/anxious.        Objective:   Physical  Exam  Constitutional: She is oriented to person, place, and time. She appears well-developed and well-nourished.  Obese.  Normal blood pressure  HENT:  Head: Normocephalic.  Right Ear: External ear normal.  Left Ear: External ear normal.  Mouth/Throat: Oropharynx is clear and moist.  Eyes: Conjunctivae and EOM are normal. Pupils are equal, round, and reactive to light.  Neck: Normal range of motion. Neck supple. No thyromegaly present.  Cardiovascular: Regular rhythm, normal heart sounds and intact distal pulses.   Slight resting tachycardia  Pulmonary/Chest: Effort normal and  breath sounds normal.  Abdominal: Soft. Bowel sounds are normal. She exhibits no mass. There is no tenderness.  Musculoskeletal: Normal range of motion.  Lymphadenopathy:    She has no cervical adenopathy.  Neurological: She is alert and oriented to person, place, and time.  Skin: Skin is warm and dry. No rash noted.  Psychiatric: She has a normal mood and affect. Her behavior is normal.          Assessment & Plan:   Panic attacks Anxiety disorder ADHD Dyslipidemia Morbid obesity  Will place on sertraline and observe.  We'll continue when necessary lorazepam FMLA forms completed Re Check 2 months

## 2014-03-25 NOTE — Patient Instructions (Signed)
Panic Attacks  Panic attacks are sudden, short-lived surges of severe anxiety, fear, or discomfort. They may occur for no reason when you are relaxed, when you are anxious, or when you are sleeping. Panic attacks may occur for a number of reasons:   · Healthy people occasionally have panic attacks in extreme, life-threatening situations, such as war or natural disasters. Normal anxiety is a protective mechanism of the body that helps us react to danger (fight or flight response).  · Panic attacks are often seen with anxiety disorders, such as panic disorder, social anxiety disorder, generalized anxiety disorder, and phobias. Anxiety disorders cause excessive or uncontrollable anxiety. They may interfere with your relationships or other life activities.  · Panic attacks are sometimes seen with other mental illnesses such as depression and posttraumatic stress disorder.  · Certain medical conditions, prescription medicines, and drugs of abuse can cause panic attacks.  SYMPTOMS   Panic attacks start suddenly, peak within 20 minutes, and are accompanied by four or more of the following symptoms:  · Pounding heart or fast heart rate (palpitations).  · Sweating.  · Trembling or shaking.  · Shortness of breath or feeling smothered.  · Feeling choked.  · Chest pain or discomfort.  · Nausea or strange feeling in your stomach.  · Dizziness, lightheadedness, or feeling like you will faint.  · Chills or hot flushes.  · Numbness or tingling in your lips or hands and feet.  · Feeling that things are not real or feeling that you are not yourself.  · Fear of losing control or going crazy.  · Fear of dying.  Some of these symptoms can mimic serious medical conditions. For example, you may think you are having a heart attack. Although panic attacks can be very scary, they are not life threatening.  DIAGNOSIS   Panic attacks are diagnosed through an assessment by your health care provider. Your health care provider will ask questions  about your symptoms, such as where and when they occurred. Your health care provider will also ask about your medical history and use of alcohol and drugs, including prescription medicines. Your health care provider may order blood tests or other studies to rule out a serious medical condition. Your health care provider may refer you to a mental health professional for further evaluation.  TREATMENT   · Most healthy people who have one or two panic attacks in an extreme, life-threatening situation will not require treatment.  · The treatment for panic attacks associated with anxiety disorders or other mental illness typically involves counseling with a mental health professional, medicine, or a combination of both. Your health care provider will help determine what treatment is best for you.  · Panic attacks due to physical illness usually goes away with treatment of the illness. If prescription medicine is causing panic attacks, talk with your health care provider about stopping the medicine, decreasing the dose, or substituting another medicine.  · Panic attacks due to alcohol or drug abuse goes away with abstinence. Some adults need professional help in order to stop drinking or using drugs.  HOME CARE INSTRUCTIONS   · Take all your medicines as prescribed.    · Check with your health care provider before starting new prescription or over-the-counter medicines.  · Keep all follow up appointments with your health care provider.  SEEK MEDICAL CARE IF:  · You are not able to take your medicines as prescribed.  · Your symptoms do not improve or get worse.  SEEK IMMEDIATE   MEDICAL CARE IF:   · You experience panic attack symptoms that are different than your usual symptoms.  · You have serious thoughts about hurting yourself or others.  · You are taking medicine for panic attacks and have a serious side effect.  MAKE SURE YOU:  · Understand these instructions.  · Will watch your condition.  · Will get help right away  if you are not doing well or get worse.  Document Released: 11/04/2005 Document Revised: 08/25/2013 Document Reviewed: 06/18/2013  [employer]® Patient Information ©2014 [employer].

## 2014-03-26 ENCOUNTER — Telehealth: Payer: Self-pay | Admitting: Internal Medicine

## 2014-03-26 NOTE — Telephone Encounter (Signed)
Relevant patient education mailed to patient.  

## 2014-06-02 ENCOUNTER — Telehealth: Payer: Self-pay | Admitting: Internal Medicine

## 2014-06-02 NOTE — Telephone Encounter (Signed)
Pt states that dr. Kirtland Bouchardk filled out her fmla paperwork, pt states she was out an additional 3 days, and is needing a note from dr. Kirtland Bouchardk stating that she is under his care he is aware of her situation and the reason for her fmla.

## 2014-06-02 NOTE — Telephone Encounter (Signed)
Ok to write note

## 2014-06-03 NOTE — Telephone Encounter (Signed)
Left message on voicemail to call office.  

## 2014-06-03 NOTE — Telephone Encounter (Signed)
Discussed with Dr. Kirtland BouchardK regarding FMLA, copy not in chart and according to last visit pt was suppose to follow up in 2 months no appointment scheduled. Pt needs follow up before additional note can be done per Dr. Kirtland BouchardK.

## 2014-06-06 NOTE — Telephone Encounter (Signed)
Left detailed message for pt needs to call office and make follow up appointment per Dr. KKirtland Bouchard

## 2014-06-10 ENCOUNTER — Ambulatory Visit (INDEPENDENT_AMBULATORY_CARE_PROVIDER_SITE_OTHER): Payer: Managed Care, Other (non HMO) | Admitting: Internal Medicine

## 2014-06-10 ENCOUNTER — Encounter: Payer: Self-pay | Admitting: Internal Medicine

## 2014-06-10 VITALS — BP 122/82 | Temp 98.1°F | Ht 66.0 in | Wt 269.0 lb

## 2014-06-10 DIAGNOSIS — F988 Other specified behavioral and emotional disorders with onset usually occurring in childhood and adolescence: Secondary | ICD-10-CM

## 2014-06-10 DIAGNOSIS — F410 Panic disorder [episodic paroxysmal anxiety] without agoraphobia: Secondary | ICD-10-CM

## 2014-06-10 DIAGNOSIS — F17200 Nicotine dependence, unspecified, uncomplicated: Secondary | ICD-10-CM

## 2014-06-10 DIAGNOSIS — E669 Obesity, unspecified: Secondary | ICD-10-CM

## 2014-06-10 DIAGNOSIS — E785 Hyperlipidemia, unspecified: Secondary | ICD-10-CM

## 2014-06-10 NOTE — Progress Notes (Signed)
Subjective:    Patient ID: Angela LentChristina M Sanchez, female    DOB: 1976/10/25, 38 y.o.   MRN: [id number]  HPI  38 year old patient who is in today in followup.  She has a history of a panic disorder.  She has had FMLA forms completed, but earlier this month, she exceeded the expected frequency of days out of work.  She requires revision of her FMLA forms.  She also is requesting a note to excuse due to her earlier  Absence.  She missed 3 days of work from July 8 through the eleventh do to her pan disorder and anxiety.  This was situated by family stressors Approximately 2 months ago.  She was placed on sertraline and has felt much improved on this medication.  She also has obtained a gym membership and is exercising more regularly. In general, she is quite pleased with her progress She has ADHD, which remained stable on Adderall. Remains on atorvastatin for dyslipidemia  Past Medical History  Diagnosis Date  . ADD 01/02/2009  . Headache(784.0) 01/02/2009  . HYPERLIPIDEMIA 01/02/2009  . TOBACCO ABUSE 01/02/2009  . Obesity     History   Social History  . Marital Status: Married    Spouse Name: N/A    Number of Children: N/A  . Years of Education: N/A   Occupational History  . Not on file.   Social History Main Topics  . Smoking status: Current Every Day Smoker -- 0.50 packs/day    Types: Cigarettes  . Smokeless tobacco: Never Used  . Alcohol Use: Yes     Comment: occasional  . Drug Use: No  . Sexual Activity: Not on file   Other Topics Concern  . Not on file   Social History Narrative  . No narrative on file    Past Surgical History  Procedure Laterality Date  . Wisdom tooth extraction      Family History  Problem Relation Age of Onset  . Diabetes Mother   . Hyperlipidemia Mother   . Birth defects Maternal Grandmother     breast, colon, brain ca    No Known Allergies  Current Outpatient Prescriptions on File Prior to Visit  Medication Sig Dispense Refill  .  amphetamine-dextroamphetamine (ADDERALL XR) 20 MG 24 hr capsule Take 1 capsule (20 mg total) by mouth every morning.  30 capsule  0  . amphetamine-dextroamphetamine (ADDERALL XR) 20 MG 24 hr capsule Take 1 capsule (20 mg total) by mouth every morning.  30 capsule  0  . amphetamine-dextroamphetamine (ADDERALL XR) 20 MG 24 hr capsule Take 1 capsule (20 mg total) by mouth every morning.  30 capsule  0  . atorvastatin (LIPITOR) 40 MG tablet Take 1 tablet (40 mg total) by mouth daily.  90 tablet  3  . LORazepam (ATIVAN) 1 MG tablet Take 1 tablet (1 mg total) by mouth 3 (three) times daily as needed for anxiety.  10 tablet  0  . sertraline (ZOLOFT) 50 MG tablet Take 1 tablet (50 mg total) by mouth daily.  30 tablet  3   No current facility-administered medications on file prior to visit.    BP 122/82  Temp(Src) 98.1 F (36.7 C) (Oral)  Ht 5\' 6"  (1.676 m)  Wt 269 lb (122.018 kg)  BMI 43.44 kg/m2      Review of Systems  Constitutional: Negative.   HENT: Negative for congestion, dental problem, hearing loss, rhinorrhea, sinus pressure, sore throat and tinnitus.   Eyes: Negative for pain, discharge and  visual disturbance.  Respiratory: Negative for cough and shortness of breath.   Cardiovascular: Negative for chest pain, palpitations and leg swelling.  Gastrointestinal: Negative for nausea, vomiting, abdominal pain, diarrhea, constipation, blood in stool and abdominal distention.  Genitourinary: Negative for dysuria, urgency, frequency, hematuria, flank pain, vaginal bleeding, vaginal discharge, difficulty urinating, vaginal pain and pelvic pain.  Musculoskeletal: Negative for arthralgias, gait problem and joint swelling.  Skin: Negative for rash.  Neurological: Negative for dizziness, syncope, speech difficulty, weakness, numbness and headaches.  Hematological: Negative for adenopathy.  Psychiatric/Behavioral: Negative for behavioral problems, dysphoric mood and agitation. The patient is  nervous/anxious.        Objective:   Physical Exam  Constitutional: She is oriented to person, place, and time. She appears well-developed and well-nourished.  Weight 269  HENT:  Head: Normocephalic.  Right Ear: External ear normal.  Left Ear: External ear normal.  Mouth/Throat: Oropharynx is clear and moist.  Eyes: Conjunctivae and EOM are normal. Pupils are equal, round, and reactive to light.  Neck: Normal range of motion. Neck supple. No thyromegaly present.  Cardiovascular: Normal rate, regular rhythm, normal heart sounds and intact distal pulses.   Pulmonary/Chest: Effort normal and breath sounds normal.  Abdominal: Soft. Bowel sounds are normal. She exhibits no mass. There is no tenderness.  Musculoskeletal: Normal range of motion.  Lymphadenopathy:    She has no cervical adenopathy.  Neurological: She is alert and oriented to person, place, and time.  Skin: Skin is warm and dry. No rash noted.  Psychiatric: She has a normal mood and affect. Her behavior is normal.          Assessment & Plan:   Panic disorder.  Improved on present regimen.  We'll continue same.  FMLA forms completed Note for work.  Dictated ADHD Dyslipidemia.  Continue atorvastatin.  Continue exercise program and attempts at weight loss Tobacco abuse.  Total smoking cessation encouraged  Recheck 4 months or as needed

## 2014-06-10 NOTE — Patient Instructions (Addendum)
It is important that you exercise regularly, at least 20 minutes 3 to 4 times per week.  If you develop chest pain or shortness of breath seek  medical attention.  You need to lose weight.  Consider a lower calorie diet and regular exercise.  Smoking tobacco is very bad for your health. You should stop smoking immediately.  Return in 4 months for followup

## 2014-06-10 NOTE — Progress Notes (Signed)
Pre visit review using our clinic review tool, if applicable. No additional management support is needed unless otherwise documented below in the visit note. 

## 2014-07-14 ENCOUNTER — Telehealth: Payer: Self-pay | Admitting: Internal Medicine

## 2014-07-14 MED ORDER — AMPHETAMINE-DEXTROAMPHET ER 20 MG PO CP24: 20.0000 mg | ORAL_CAPSULE | ORAL | Status: DC

## 2014-07-14 NOTE — Telephone Encounter (Signed)
Left detailed message Rx's ready for pickup. Rx's printed and signed. 

## 2014-07-14 NOTE — Telephone Encounter (Signed)
Pt is needing new rx amphetamine-dextroamphetamine (ADDERALL XR) 20 MG 24 hr capsule, please call when available for pick up. ° °

## 2014-08-26 ENCOUNTER — Telehealth: Payer: Self-pay | Admitting: Internal Medicine

## 2014-08-26 MED ORDER — AMPHETAMINE-DEXTROAMPHET ER 20 MG PO CP24: 20.0000 mg | ORAL_CAPSULE | ORAL | Status: DC

## 2014-08-26 NOTE — Telephone Encounter (Signed)
rx up front for p/u, pt aware 

## 2014-08-26 NOTE — Telephone Encounter (Signed)
Pt needs new rx generic adderall xr 20 mg °

## 2014-08-26 NOTE — Telephone Encounter (Signed)
ok 

## 2014-12-13 ENCOUNTER — Telehealth: Payer: Self-pay | Admitting: Internal Medicine

## 2014-12-13 NOTE — Telephone Encounter (Signed)
Pt request refill amphetamine-dextroamphetamine (ADDERALL XR) 20 MG 24 hr capsule °3 mo supply °

## 2014-12-14 MED ORDER — AMPHETAMINE-DEXTROAMPHET ER 20 MG PO CP24: 20.0000 mg | ORAL_CAPSULE | ORAL | Status: DC

## 2014-12-14 NOTE — Telephone Encounter (Signed)
Pt notified Rx ready for pickup. Rx printed and signed.  

## 2014-12-21 ENCOUNTER — Other Ambulatory Visit: Payer: Managed Care, Other (non HMO)

## 2014-12-28 ENCOUNTER — Encounter: Payer: Managed Care, Other (non HMO) | Admitting: Internal Medicine

## 2015-04-13 ENCOUNTER — Telehealth: Payer: Self-pay | Admitting: Internal Medicine

## 2015-04-13 NOTE — Telephone Encounter (Signed)
Patient is requesting re-fill on amphetamine-dextroamphetamine (ADDERALL XR) 20 MG 24 hr capsule. °

## 2015-04-14 MED ORDER — AMPHETAMINE-DEXTROAMPHET ER 20 MG PO CP24: 20.0000 mg | ORAL_CAPSULE | ORAL | Status: DC

## 2015-04-14 NOTE — Telephone Encounter (Signed)
Left detailed message Rx's ready for pickup, will be at the front desk. Rx's printed and signed. 

## 2015-08-15 ENCOUNTER — Telehealth: Payer: Self-pay | Admitting: Internal Medicine

## 2015-08-15 NOTE — Telephone Encounter (Signed)
Pt has an appt on 09-08-15

## 2015-08-15 NOTE — Telephone Encounter (Signed)
° ° ° °  Pt request refill of the following: ° ° °amphetamine-dextroamphetamine (ADDERALL XR) 20 MG 24 hr capsule ° ° °Phamacy: °

## 2015-08-15 NOTE — Telephone Encounter (Signed)
Please schedule pt for medication follow up, needs appt. I can not refill Adderall until pt is seen.

## 2015-08-15 NOTE — Telephone Encounter (Signed)
lmovm to call back and schedule an appt  °

## 2015-08-15 NOTE — Telephone Encounter (Signed)
Spoke with pt and she said she will call back.

## 2015-08-16 ENCOUNTER — Encounter: Payer: Self-pay | Admitting: Internal Medicine

## 2015-08-16 ENCOUNTER — Other Ambulatory Visit: Payer: Self-pay | Admitting: *Deleted

## 2015-08-16 ENCOUNTER — Ambulatory Visit (INDEPENDENT_AMBULATORY_CARE_PROVIDER_SITE_OTHER): Payer: 59 | Admitting: Internal Medicine

## 2015-08-16 VITALS — BP 120/80 | HR 107 | Temp 98.4°F | Resp 20 | Ht 66.0 in | Wt 268.0 lb

## 2015-08-16 DIAGNOSIS — Z720 Tobacco use: Secondary | ICD-10-CM | POA: Diagnosis not present

## 2015-08-16 DIAGNOSIS — F17200 Nicotine dependence, unspecified, uncomplicated: Secondary | ICD-10-CM

## 2015-08-16 DIAGNOSIS — E785 Hyperlipidemia, unspecified: Secondary | ICD-10-CM | POA: Diagnosis not present

## 2015-08-16 DIAGNOSIS — E669 Obesity, unspecified: Secondary | ICD-10-CM

## 2015-08-16 DIAGNOSIS — F988 Other specified behavioral and emotional disorders with onset usually occurring in childhood and adolescence: Secondary | ICD-10-CM

## 2015-08-16 DIAGNOSIS — F909 Attention-deficit hyperactivity disorder, unspecified type: Secondary | ICD-10-CM | POA: Diagnosis not present

## 2015-08-16 MED ORDER — AMPHETAMINE-DEXTROAMPHET ER 20 MG PO CP24: 20.0000 mg | ORAL_CAPSULE | ORAL | Status: DC

## 2015-08-16 MED ORDER — SERTRALINE HCL 50 MG PO TABS: 50.0000 mg | ORAL_TABLET | Freq: Every day | ORAL | Status: DC

## 2015-08-16 MED ORDER — ATORVASTATIN CALCIUM 40 MG PO TABS: 40.0000 mg | ORAL_TABLET | Freq: Every day | ORAL | Status: DC

## 2015-08-16 NOTE — Progress Notes (Signed)
Subjective:    Patient ID: Angela Townsend, female    DOB: 01/05/1976, 39 y.o.   MRN: [id number]  HPI  Wt Readings from Last 3 Encounters:  08/16/15 268 lb (121.564 kg)  06/10/14 269 lb (122.018 kg)  03/25/14 275 lb (124.52 kg)  39 year old patient who has a history of ADHD.  She is seen today for a medicine refill and has not been here in over one year.  Doing quite well on her present regimen.  She is scheduled for a physical next month   She does occasionally give herself drug holidays on weekends when she is not working  Past Medical History  Diagnosis Date  . ADD 01/02/2009  . Headache(784.0) 01/02/2009  . HYPERLIPIDEMIA 01/02/2009  . TOBACCO ABUSE 01/02/2009  . Obesity     Social History   Social History  . Marital Status: Married    Spouse Name: N/A  . Number of Children: N/A  . Years of Education: N/A   Occupational History  . Not on file.   Social History Main Topics  . Smoking status: Current Every Day Smoker -- 0.50 packs/day    Types: Cigarettes  . Smokeless tobacco: Never Used  . Alcohol Use: Yes     Comment: occasional  . Drug Use: No  . Sexual Activity: Not on file   Other Topics Concern  . Not on file   Social History Narrative    Past Surgical History  Procedure Laterality Date  . Wisdom tooth extraction      Family History  Problem Relation Age of Onset  . Diabetes Mother   . Hyperlipidemia Mother   . Birth defects Maternal Grandmother     breast, colon, brain ca    No Known Allergies  Current Outpatient Prescriptions on File Prior to Visit  Medication Sig Dispense Refill  . sertraline (ZOLOFT) 50 MG tablet Take 1 tablet (50 mg total) by mouth daily. 30 tablet 3  . atorvastatin (LIPITOR) 40 MG tablet Take 1 tablet (40 mg total) by mouth daily. 90 tablet 3   No current facility-administered medications on file prior to visit.    BP 120/80 mmHg  Pulse 107  Temp(Src) 98.4 F (36.9 C) (Oral)  Resp 20  Ht  (1.676 m)   Wt 268 lb (121.564 kg)  BMI 43.28 kg/m2  SpO2 98%     Review of Systems  Constitutional: Negative.   HENT: Negative for congestion, dental problem, hearing loss, rhinorrhea, sinus pressure, sore throat and tinnitus.   Eyes: Negative for pain, discharge and visual disturbance.  Respiratory: Negative for cough and shortness of breath.   Cardiovascular: Negative for chest pain, palpitations and leg swelling.  Gastrointestinal: Negative for nausea, vomiting, abdominal pain, diarrhea, constipation, blood in stool and abdominal distention.  Genitourinary: Negative for dysuria, urgency, frequency, hematuria, flank pain, vaginal bleeding, vaginal discharge, difficulty urinating, vaginal pain and pelvic pain.  Musculoskeletal: Negative for joint swelling, arthralgias and gait problem.  Skin: Negative for rash.  Neurological: Negative for dizziness, syncope, speech difficulty, weakness, numbness and headaches.  Hematological: Negative for adenopathy.  Psychiatric/Behavioral: Positive for decreased concentration. Negative for behavioral problems, dysphoric mood and agitation. The patient is not nervous/anxious.        Objective:   Physical Exam  Constitutional: She is oriented to person, place, and time. She appears well-developed and well-nourished.  HENT:  Head: Normocephalic.  Right Ear: External ear normal.  Left Ear: External ear normal.  Mouth/Throat: Oropharynx is clear and moist.  Eyes: Conjunctivae and EOM are normal. Pupils are equal, round, and reactive to light.  Neck: Normal range of motion. Neck supple. No thyromegaly present.  Cardiovascular: Normal rate, regular rhythm, normal heart sounds and intact distal pulses.   Pulmonary/Chest: Effort normal and breath sounds normal.  Abdominal: Soft. Bowel sounds are normal. She exhibits no mass. There is no tenderness.  Musculoskeletal: Normal range of motion.  Lymphadenopathy:    She has no cervical adenopathy.  Neurological: She  is alert and oriented to person, place, and time.  Skin: Skin is warm and dry. No rash noted.  Psychiatric: She has a normal mood and affect. Her behavior is normal.          Assessment & Plan:  ADHD.  Adderall refilled Dyslipidemia.  Atorvastatin refilled  CPX as scheduled next month

## 2015-08-16 NOTE — Patient Instructions (Signed)
Return next month for your annual exam as scheduled

## 2015-08-16 NOTE — Progress Notes (Signed)
Pre visit review using our clinic review tool, if applicable. No additional management support is needed unless otherwise documented below in the visit note. 

## 2015-08-28 ENCOUNTER — Other Ambulatory Visit (INDEPENDENT_AMBULATORY_CARE_PROVIDER_SITE_OTHER): Payer: 59

## 2015-08-28 DIAGNOSIS — Z0000 Encounter for general adult medical examination without abnormal findings: Secondary | ICD-10-CM

## 2015-08-28 DIAGNOSIS — R7989 Other specified abnormal findings of blood chemistry: Secondary | ICD-10-CM

## 2015-08-28 LAB — POCT URINALYSIS DIPSTICK
BILIRUBIN UA: NEGATIVE
GLUCOSE UA: NEGATIVE
KETONES UA: NEGATIVE
Nitrite, UA: NEGATIVE
PH UA: 5
Protein, UA: NEGATIVE
RBC UA: NEGATIVE
Urobilinogen, UA: 0.2

## 2015-08-28 LAB — CBC WITH DIFFERENTIAL/PLATELET
Basophils Absolute: 0 10*3/uL (ref 0.0–0.1)
Basophils Relative: 0.3 % (ref 0.0–3.0)
Eosinophils Absolute: 0.3 10*3/uL (ref 0.0–0.7)
Eosinophils Relative: 3.7 % (ref 0.0–5.0)
HEMATOCRIT: 44.3 % (ref 36.0–46.0)
Hemoglobin: 14.8 g/dL (ref 12.0–15.0)
LYMPHS PCT: 33.8 % (ref 12.0–46.0)
Lymphs Abs: 2.4 10*3/uL (ref 0.7–4.0)
MCHC: 33.4 g/dL (ref 30.0–36.0)
MCV: 87.8 fl (ref 78.0–100.0)
MONOS PCT: 3.9 % (ref 3.0–12.0)
Monocytes Absolute: 0.3 10*3/uL (ref 0.1–1.0)
Neutro Abs: 4.1 10*3/uL (ref 1.4–7.7)
Neutrophils Relative %: 58.3 % (ref 43.0–77.0)
Platelets: 224 10*3/uL (ref 150.0–400.0)
RBC: 5.04 Mil/uL (ref 3.87–5.11)
RDW: 14 % (ref 11.5–15.5)
WBC: 7.1 10*3/uL (ref 4.0–10.5)

## 2015-08-28 LAB — BASIC METABOLIC PANEL
BUN: 9 mg/dL (ref 6–23)
CO2: 27 meq/L (ref 19–32)
Calcium: 9.4 mg/dL (ref 8.4–10.5)
Chloride: 104 mEq/L (ref 96–112)
Creatinine, Ser: 0.89 mg/dL (ref 0.40–1.20)
GFR: 75.55 mL/min (ref >60.00)
Glucose, Bld: 92 mg/dL (ref 70–99)
POTASSIUM: 4.4 meq/L (ref 3.5–5.1)
SODIUM: 140 meq/L (ref 135–145)

## 2015-08-28 LAB — LIPID PANEL
CHOL/HDL RATIO: 7
Cholesterol: 280 mg/dL — ABNORMAL HIGH (ref 0–200)
HDL: 39.3 mg/dL (ref >39.00)
NONHDL: 240.71
Triglycerides: 298 mg/dL — ABNORMAL HIGH (ref 0.0–149.0)
VLDL: 59.6 mg/dL — ABNORMAL HIGH (ref 0.0–40.0)

## 2015-08-28 LAB — HEPATIC FUNCTION PANEL
ALBUMIN: 4 g/dL (ref 3.5–5.2)
ALK PHOS: 53 U/L (ref 39–117)
ALT: 15 U/L (ref 0–35)
AST: 15 U/L (ref 0–37)
Bilirubin, Direct: 0.1 mg/dL (ref 0.0–0.3)
TOTAL PROTEIN: 7 g/dL (ref 6.0–8.3)
Total Bilirubin: 0.7 mg/dL (ref 0.2–1.2)

## 2015-08-28 LAB — TSH: TSH: 1.41 u[IU]/mL (ref 0.35–4.50)

## 2015-08-28 LAB — LDL CHOLESTEROL, DIRECT: LDL DIRECT: 189 mg/dL

## 2015-09-08 ENCOUNTER — Encounter: Payer: Self-pay | Admitting: Internal Medicine

## 2015-09-08 ENCOUNTER — Ambulatory Visit (INDEPENDENT_AMBULATORY_CARE_PROVIDER_SITE_OTHER): Payer: 59 | Admitting: Internal Medicine

## 2015-09-08 VITALS — BP 110/80 | HR 109 | Temp 98.5°F | Resp 16 | Ht 66.25 in | Wt 272.0 lb

## 2015-09-08 DIAGNOSIS — E785 Hyperlipidemia, unspecified: Secondary | ICD-10-CM

## 2015-09-08 DIAGNOSIS — Z0000 Encounter for general adult medical examination without abnormal findings: Secondary | ICD-10-CM | POA: Diagnosis not present

## 2015-09-08 DIAGNOSIS — F988 Other specified behavioral and emotional disorders with onset usually occurring in childhood and adolescence: Secondary | ICD-10-CM

## 2015-09-08 DIAGNOSIS — F17200 Nicotine dependence, unspecified, uncomplicated: Secondary | ICD-10-CM

## 2015-09-08 NOTE — Progress Notes (Signed)
Pre visit review using our clinic review tool, if applicable. No additional management support is needed unless otherwise documented below in the visit note. 

## 2015-09-08 NOTE — Progress Notes (Signed)
Subjective:    Patient ID: Angela LentChristina M Townsend, female    DOB: Nov 07, 1976, 39 y.o.   MRN: [id number]  HPI 39 year-old patient who is seen today for a health maintenance exam.  Medical problems include exogenous obesity dyslipidemia and ongoing tobacco use. She also has a history of ADHD. She is presently a current everyday smoker at one half pack per day.  Social history:  patient has been a resident of [place] since 2000. Parents are separated and lives in [place] and [place]. 2 daughters ages 7311 and 7815.  Family history father age 37 in good health. Mother age 37  with diabetes and dyslipidemia One brother and one sister in good health  Past Medical History  Diagnosis Date  . ADD 01/02/2009  . Headache(784.0) 01/02/2009  . HYPERLIPIDEMIA 01/02/2009  . TOBACCO ABUSE 01/02/2009  . Obesity     Social History   Social History  . Marital Status: Married    Spouse Name: N/A  . Number of Children: N/A  . Years of Education: N/A   Occupational History  . Not on file.   Social History Main Topics  . Smoking status: Current Every Day Smoker -- 0.50 packs/day    Types: Cigarettes  . Smokeless tobacco: Never Used  . Alcohol Use: Yes     Comment: occasional  . Drug Use: No  . Sexual Activity: Not on file   Other Topics Concern  . Not on file   Social History Narrative    Past Surgical History  Procedure Laterality Date  . Wisdom tooth extraction      Family History  Problem Relation Age of Onset  . Diabetes Mother   . Hyperlipidemia Mother   . Birth defects Maternal Grandmother     breast, colon, brain ca    No Known Allergies  Current Outpatient Prescriptions on File Prior to Visit  Medication Sig Dispense Refill  . amphetamine-dextroamphetamine (ADDERALL XR) 20 MG 24 hr capsule Take 1 capsule (20 mg total) by mouth every morning. 30 capsule 0  . amphetamine-dextroamphetamine (ADDERALL XR) 20 MG 24 hr capsule Take 1 capsule (20 mg total) by mouth every  morning. 30 capsule 0  . amphetamine-dextroamphetamine (ADDERALL XR) 20 MG 24 hr capsule Take 1 capsule (20 mg total) by mouth every morning. 30 capsule 0  . sertraline (ZOLOFT) 50 MG tablet Take 1 tablet (50 mg total) by mouth daily. 30 tablet 5  . atorvastatin (LIPITOR) 40 MG tablet Take 1 tablet (40 mg total) by mouth daily. (Patient not taking: Reported on 09/08/2015) 90 tablet 1   No current facility-administered medications on file prior to visit.    BP 110/80 mmHg  Pulse 109  Temp(Src) 98.5 F (36.9 C) (Oral)  Resp 16  Ht 5' 6.25" (1.683 m)  Wt 272 lb (123.378 kg)  BMI 43.56 kg/m2  LMP 08/25/2015 (Within Weeks)       Review of Systems  Constitutional: Negative for fever, appetite change, fatigue and unexpected weight change.  HENT: Negative for congestion, dental problem, ear pain, hearing loss, mouth sores, nosebleeds, sinus pressure, sore throat, tinnitus, trouble swallowing and voice change.   Eyes: Negative for photophobia, pain, redness and visual disturbance.  Respiratory: Negative for cough, chest tightness and shortness of breath.   Cardiovascular: Negative for chest pain, palpitations and leg swelling.  Gastrointestinal: Negative for nausea, vomiting, abdominal pain, diarrhea, constipation, blood in stool, abdominal distention and rectal pain.  Genitourinary: Negative for dysuria, urgency, frequency, hematuria, flank pain, vaginal bleeding, vaginal  discharge, difficulty urinating, genital sores, vaginal pain, menstrual problem and pelvic pain.  Musculoskeletal: Negative for back pain, arthralgias and neck stiffness.  Skin: Negative for rash.  Neurological: Negative for dizziness, syncope, speech difficulty, weakness, light-headedness, numbness and headaches.  Hematological: Negative for adenopathy. Does not bruise/bleed easily.  Psychiatric/Behavioral: Negative for suicidal ideas, behavioral problems, self-injury, dysphoric mood and agitation. The patient is not  nervous/anxious.        Objective:   Physical Exam  Constitutional: She is oriented to person, place, and time. She appears well-developed and well-nourished.  Blood pressure low normal Weight 265  HENT:  Head: Normocephalic and atraumatic.  Right Ear: External ear normal.  Left Ear: External ear normal.  Mouth/Throat: Oropharynx is clear and moist.  Eyes: Conjunctivae and EOM are normal.  Neck: Normal range of motion. Neck supple. No JVD present. No thyromegaly present.  Cardiovascular: Normal rate, regular rhythm, normal heart sounds and intact distal pulses.   No murmur heard. Pulmonary/Chest: Effort normal and breath sounds normal. She has no wheezes. She has no rales.  Abdominal: Soft. Bowel sounds are normal. She exhibits no distension and no mass. There is no tenderness. There is no rebound and no guarding.  Musculoskeletal: Normal range of motion. She exhibits no edema or tenderness.  Neurological: She is alert and oriented to person, place, and time. She has normal reflexes. No cranial nerve deficit. She exhibits normal muscle tone. Coordination normal.  Skin: Skin is warm and dry. No rash noted.  Psychiatric: She has a normal mood and affect. Her behavior is normal.          Assessment & Plan:   Preventive health examination Exogenous obesity. Weight loss exercise encouraged; referral to dietary discussed Tobacco abuse. Total smoking cessation encouraged Dyslipidemia. Compliance with Lipitor discussed. Will resume ADHD stable  Laboratory update will be reviewed  Medications refilled Recheck 12 months Weight loss exercise and smoking cessation all encouraged

## 2015-09-08 NOTE — Patient Instructions (Addendum)
It is important that you exercise regularly, at least 20 minutes 3 to 4 times per week.  If you develop chest pain or shortness of breath seek  medical attention.  Smoking tobacco is very bad for your health. You should stop smoking immediately.  You need to lose weight.  Consider a lower calorie diet and regular exercise.Health Maintenance, Female Adopting a healthy lifestyle and getting preventive care can go a long way to promote health and wellness. Talk with your health care provider about what schedule of regular examinations is right for you. This is a good chance for you to check in with your provider about disease prevention and staying healthy. In between checkups, there are plenty of things you can do on your own. Experts have done a lot of research about which lifestyle changes and preventive measures are most likely to keep you healthy. Ask your health care provider for more information. WEIGHT AND DIET  Eat a healthy diet  Be sure to include plenty of vegetables, fruits, low-fat dairy products, and lean protein.  Do not eat a lot of foods high in solid fats, added sugars, or salt.  Get regular exercise. This is one of the most important things you can do for your health.  Most adults should exercise for at least 150 minutes each week. The exercise should increase your heart rate and make you sweat (moderate-intensity exercise).  Most adults should also do strengthening exercises at least twice a week. This is in addition to the moderate-intensity exercise.  Maintain a healthy weight  Body mass index (BMI) is a measurement that can be used to identify possible weight problems. It estimates body fat based on height and weight. Your health care provider can help determine your BMI and help you achieve or maintain a healthy weight.  For females 39 years of age and older:   A BMI below 18.5 is considered underweight.  A BMI of 18.5 to 24.9 is normal.  A BMI of 25 to 29.9 is  considered overweight.  A BMI of 30 and above is considered obese.  Watch levels of cholesterol and blood lipids  You should start having your blood tested for lipids and cholesterol at 37 years of age, then have this test every 5 years.  You may need to have your cholesterol levels checked more often if:  Your lipid or cholesterol levels are high.  You are older than 37 years of age.  You are at high risk for heart disease.  CANCER SCREENING   Lung Cancer  Lung cancer screening is recommended for adults 39-66 years old who are at high risk for lung cancer because of a history of smoking.  A yearly low-dose CT scan of the lungs is recommended for people who:  Currently smoke.  Have quit within the past 15 years.  Have at least a 30-pack-year history of smoking. A pack year is smoking an average of one pack of cigarettes a day for 1 year.  Yearly screening should continue until it has been 15 years since you quit.  Yearly screening should stop if you develop a health problem that would prevent you from having lung cancer treatment.  Breast Cancer  Practice breast self-awareness. This means understanding how your breasts normally appear and feel.  It also means doing regular breast self-exams. Let your health care provider know about any changes, no matter how small.  If you are in your 20s or 30s, you should have a clinical breast exam (  CBE) by a health care provider every 1-3 years as part of a regular health exam.  If you are 48 or older, have a CBE every year. Also consider having a breast X-ray (mammogram) every year.  If you have a family history of breast cancer, talk to your health care provider about genetic screening.  If you are at high risk for breast cancer, talk to your health care provider about having an MRI and a mammogram every year.  Breast cancer gene (BRCA) assessment is recommended for women who have family members with BRCA-related cancers.  BRCA-related cancers include:  Breast.  Ovarian.  Tubal.  Peritoneal cancers.  Results of the assessment will determine the need for genetic counseling and BRCA1 and BRCA2 testing. Cervical Cancer Your health care provider may recommend that you be screened regularly for cancer of the pelvic organs (ovaries, uterus, and vagina). This screening involves a pelvic examination, including checking for microscopic changes to the surface of your cervix (Pap test). You may be encouraged to have this screening done every 3 years, beginning at age 31.  For women ages 63-65, health care providers may recommend pelvic exams and Pap testing every 3 years, or they may recommend the Pap and pelvic exam, combined with testing for human papilloma virus (HPV), every 5 years. Some types of HPV increase your risk of cervical cancer. Testing for HPV may also be done on women of any age with unclear Pap test results.  Other health care providers may not recommend any screening for nonpregnant women who are considered low risk for pelvic cancer and who do not have symptoms. Ask your health care provider if a screening pelvic exam is right for you.  If you have had past treatment for cervical cancer or a condition that could lead to cancer, you need Pap tests and screening for cancer for at least 20 years after your treatment. If Pap tests have been discontinued, your risk factors (such as having a new sexual partner) need to be reassessed to determine if screening should resume. Some women have medical problems that increase the chance of getting cervical cancer. In these cases, your health care provider may recommend more frequent screening and Pap tests. Colorectal Cancer  This type of cancer can be detected and often prevented.  Routine colorectal cancer screening usually begins at 37 years of age and continues through 37 years of age.  Your health care provider may recommend screening at an earlier age if you  have risk factors for colon cancer.  Your health care provider may also recommend using home test kits to check for hidden blood in the stool.  A small camera at the end of a tube can be used to examine your colon directly (sigmoidoscopy or colonoscopy). This is done to check for the earliest forms of colorectal cancer.  Routine screening usually begins at age 88.  Direct examination of the colon should be repeated every 5-10 years through 37 years of age. However, you may need to be screened more often if early forms of precancerous polyps or small growths are found. Skin Cancer  Check your skin from head to toe regularly.  Tell your health care provider about any new moles or changes in moles, especially if there is a change in a mole's shape or color.  Also tell your health care provider if you have a mole that is larger than the size of a pencil eraser.  Always use sunscreen. Apply sunscreen liberally and repeatedly throughout  the day.  Protect yourself by wearing long sleeves, pants, a wide-brimmed hat, and sunglasses whenever you are outside. HEART DISEASE, DIABETES, AND HIGH BLOOD PRESSURE   High blood pressure causes heart disease and increases the risk of stroke. High blood pressure is more likely to develop in:  People who have blood pressure in the high end of the normal range (130-139/85-89 mm Hg).  People who are overweight or obese.  People who are African American.  If you are 104-76 years of age, have your blood pressure checked every 3-5 years. If you are 61 years of age or older, have your blood pressure checked every year. You should have your blood pressure measured twice--once when you are at a hospital or clinic, and once when you are not at a hospital or clinic. Record the average of the two measurements. To check your blood pressure when you are not at a hospital or clinic, you can use:  An automated blood pressure machine at a pharmacy.  A home blood pressure  monitor.  If you are between 39 years and 63 years old, ask your health care provider if you should take aspirin to prevent strokes.  Have regular diabetes screenings. This involves taking a blood sample to check your fasting blood sugar level.  If you are at a normal weight and have a low risk for diabetes, have this test once every three years after 37 years of age.  If you are overweight and have a high risk for diabetes, consider being tested at a younger age or more often. PREVENTING INFECTION  Hepatitis B  If you have a higher risk for hepatitis B, you should be screened for this virus. You are considered at high risk for hepatitis B if:  You were born in a country where hepatitis B is common. Ask your health care provider which countries are considered high risk.  Your parents were born in a high-risk country, and you have not been immunized against hepatitis B (hepatitis B vaccine).  You have HIV or AIDS.  You use needles to inject street drugs.  You live with someone who has hepatitis B.  You have had sex with someone who has hepatitis B.  You get hemodialysis treatment.  You take certain medicines for conditions, including cancer, organ transplantation, and autoimmune conditions. Hepatitis C  Blood testing is recommended for:  Everyone born from [birth year] through 1963.  Anyone with known risk factors for hepatitis C. Sexually transmitted infections (STIs)  You should be screened for sexually transmitted infections (STIs) including gonorrhea and chlamydia if:  You are sexually active and are younger than 37 years of age.  You are older than 37 years of age and your health care provider tells you that you are at risk for this type of infection.  Your sexual activity has changed since you were last screened and you are at an increased risk for chlamydia or gonorrhea. Ask your health care provider if you are at risk.  If you do not have HIV, but are at risk, it may be  recommended that you take a prescription medicine daily to prevent HIV infection. This is called pre-exposure prophylaxis (PrEP). You are considered at risk if:  You are sexually active and do not regularly use condoms or know the HIV status of your partner(s).  You take drugs by injection.  You are sexually active with a partner who has HIV. Talk with your health care provider about whether you are at high risk  of being infected with HIV. If you choose to begin PrEP, you should first be tested for HIV. You should then be tested every 3 months for as long as you are taking PrEP.  PREGNANCY   If you are premenopausal and you may become pregnant, ask your health care provider about preconception counseling.  If you may become pregnant, take 400 to 800 micrograms (mcg) of folic acid every day.  If you want to prevent pregnancy, talk to your health care provider about birth control (contraception). OSTEOPOROSIS AND MENOPAUSE   Osteoporosis is a disease in which the bones lose minerals and strength with aging. This can result in serious bone fractures. Your risk for osteoporosis can be identified using a bone density scan.  If you are 51 years of age or older, or if you are at risk for osteoporosis and fractures, ask your health care provider if you should be screened.  Ask your health care provider whether you should take a calcium or vitamin D supplement to lower your risk for osteoporosis.  Menopause may have certain physical symptoms and risks.  Hormone replacement therapy may reduce some of these symptoms and risks. Talk to your health care provider about whether hormone replacement therapy is right for you.  HOME CARE INSTRUCTIONS   Schedule regular health, dental, and eye exams.  Stay current with your immunizations.   Do not use any tobacco products including cigarettes, chewing tobacco, or electronic cigarettes.  If you are pregnant, do not drink alcohol.  If you are  breastfeeding, limit how much and how often you drink alcohol.  Limit alcohol intake to no more than 1 drink per day for nonpregnant women. One drink equals 12 ounces of beer, 5 ounces of wine, or 1 ounces of hard liquor.  Do not use street drugs.  Do not share needles.  Ask your health care provider for help if you need support or information about quitting drugs.  Tell your health care provider if you often feel depressed.  Tell your health care provider if you have ever been abused or do not feel safe at home.   This information is not intended to replace advice given to you by your health care provider. Make sure you discuss any questions you have with your health care provider.   Document Released: 05/20/2011 Document Revised: 11/25/2014 Document Reviewed: 10/06/2013 Elsevier Interactive Patient Education [employer].

## 2015-11-28 ENCOUNTER — Telehealth: Payer: Self-pay | Admitting: Internal Medicine

## 2015-11-28 MED ORDER — AMPHETAMINE-DEXTROAMPHET ER 20 MG PO CP24: 20.0000 mg | ORAL_CAPSULE | ORAL | Status: DC

## 2015-11-28 NOTE — Telephone Encounter (Signed)
Left message on voicemail Rx's ready for pickup will be at the front desk. Rx's printed and signed.  

## 2015-11-28 NOTE — Telephone Encounter (Signed)
° ° ° °  Pt request refill of the following: ° ° °amphetamine-dextroamphetamine (ADDERALL XR) 20 MG 24 hr capsule ° ° °Phamacy: °

## 2016-03-28 ENCOUNTER — Telehealth: Payer: Self-pay | Admitting: Internal Medicine

## 2016-03-28 NOTE — Telephone Encounter (Signed)
Pt needs new adderall xr 20 mg

## 2016-03-29 MED ORDER — AMPHETAMINE-DEXTROAMPHET ER 20 MG PO CP24: 20.0000 mg | ORAL_CAPSULE | ORAL | Status: DC

## 2016-03-29 NOTE — Telephone Encounter (Signed)
Left message on voicemail Rx's ready for pickup, will be at the front desk. Rx's printed and signed. 

## 2016-04-04 ENCOUNTER — Other Ambulatory Visit: Payer: Self-pay | Admitting: *Deleted

## 2016-04-04 MED ORDER — SERTRALINE HCL 50 MG PO TABS: 50.0000 mg | ORAL_TABLET | Freq: Every day | ORAL | Status: DC

## 2016-04-17 ENCOUNTER — Encounter: Payer: Self-pay | Admitting: Internal Medicine

## 2016-04-17 ENCOUNTER — Ambulatory Visit (INDEPENDENT_AMBULATORY_CARE_PROVIDER_SITE_OTHER): Payer: 59 | Admitting: Internal Medicine

## 2016-04-17 VITALS — BP 122/80 | HR 108 | Temp 98.1°F | Resp 20 | Ht 66.25 in | Wt 281.0 lb

## 2016-04-17 DIAGNOSIS — E785 Hyperlipidemia, unspecified: Secondary | ICD-10-CM

## 2016-04-17 DIAGNOSIS — I471 Supraventricular tachycardia, unspecified: Secondary | ICD-10-CM

## 2016-04-17 DIAGNOSIS — F17200 Nicotine dependence, unspecified, uncomplicated: Secondary | ICD-10-CM

## 2016-04-17 LAB — T4, FREE: FREE T4: 0.76 ng/dL (ref 0.60–1.60)

## 2016-04-17 LAB — TSH: TSH: 1.97 u[IU]/mL (ref 0.35–4.50)

## 2016-04-17 NOTE — Progress Notes (Signed)
Subjective:    Patient ID: Angela Townsend, female    DOB: 1976/11/16, 40 y.o.   MRN: [id number]  HPI  40 year old patient without prior cardiac history who is treated for PSVT on May 25.  At the time she was [place]  She was treated with adenosine in the clinic and [place] seen [place] a local ER.  Since the initial episode she has had no recurrent palpitations.  Associated symptoms included mild diaphoresis, shortness of breath and anxiety as well as lightheadedness She does have a history of ADHD and has been on Adderall but no recent dose escalation On the night of her PSVT.  She did consume 2 glasses of wine, but no alcohol to excess.  She continues to smoke.  She drinks coffee but not caffeinated beverages. She remains quite anxious about the episode.  No prior.  2-D echocardiograms  Past Medical History  Diagnosis Date  . ADD 01/02/2009  . Headache(784.0) 01/02/2009  . HYPERLIPIDEMIA 01/02/2009  . TOBACCO ABUSE 01/02/2009  . Obesity      Social History   Social History  . Marital Status: Married    Spouse Name: N/A  . Number of Children: N/A  . Years of Education: N/A   Occupational History  . Not on file.   Social History Main Topics  . Smoking status: Current Every Day Smoker -- 0.50 packs/day    Types: Cigarettes  . Smokeless tobacco: Never Used  . Alcohol Use: Yes     Comment: occasional  . Drug Use: No  . Sexual Activity: Not on file   Other Topics Concern  . Not on file   Social History Narrative    Past Surgical History  Procedure Laterality Date  . Wisdom tooth extraction      Family History  Problem Relation Age of Onset  . Diabetes Mother   . Hyperlipidemia Mother   . Birth defects Maternal Grandmother     breast, colon, brain ca    No Known Allergies  Current Outpatient Prescriptions on File Prior to Visit  Medication Sig Dispense Refill  . amphetamine-dextroamphetamine (ADDERALL XR) 20 MG 24 hr  capsule Take 1 capsule (20 mg total) by mouth every morning. 30 capsule 0  . amphetamine-dextroamphetamine (ADDERALL XR) 20 MG 24 hr capsule Take 1 capsule (20 mg total) by mouth every morning. 30 capsule 0  . amphetamine-dextroamphetamine (ADDERALL XR) 20 MG 24 hr capsule Take 1 capsule (20 mg total) by mouth every morning. 30 capsule 0  . atorvastatin (LIPITOR) 40 MG tablet Take 1 tablet (40 mg total) by mouth daily. 90 tablet 1  . sertraline (ZOLOFT) 50 MG tablet Take 1 tablet (50 mg total) by mouth daily. 90 tablet 0   No current facility-administered medications on file prior to visit.    BP 122/80 mmHg  Pulse 108  Temp(Src) 98.1 F (36.7 C) (Oral)  Resp 20  Ht 5' 6.25" (1.683 m)  Wt 281 lb (127.461 kg)  BMI 45.00 kg/m2  SpO2 98%  LMP 04/14/2016     Review of Systems  Constitutional: Negative.   HENT: Negative for congestion, dental problem, hearing loss, rhinorrhea, sinus pressure, sore throat and tinnitus.   Eyes: Negative for pain, discharge and visual disturbance.  Respiratory: Negative for cough and shortness of breath.   Cardiovascular: Positive for palpitations. Negative for chest pain and leg swelling.  Gastrointestinal: Negative for nausea, vomiting, abdominal pain, diarrhea, constipation, blood in stool  and abdominal distention.  Genitourinary: Negative for dysuria, urgency, frequency, hematuria, flank pain, vaginal bleeding, vaginal discharge, difficulty urinating, vaginal pain and pelvic pain.  Musculoskeletal: Negative for joint swelling, arthralgias and gait problem.  Skin: Negative for rash.  Neurological: Negative for dizziness, syncope, speech difficulty, weakness, numbness and headaches.  Hematological: Negative for adenopathy.  Psychiatric/Behavioral: Negative for behavioral problems, dysphoric mood and agitation. The patient is nervous/anxious.        Objective:   Physical Exam  Constitutional: She is oriented to person, place, and time. She appears  well-developed and well-nourished.  Obese Mildly anxious Normal blood pressure Resting pulse rate 100  HENT:  Head: Normocephalic.  Right Ear: External ear normal.  Left Ear: External ear normal.  Mouth/Throat: Oropharynx is clear and moist.  Eyes: Conjunctivae and EOM are normal. Pupils are equal, round, and reactive to light.  Neck: Normal range of motion. Neck supple. No thyromegaly present.  Cardiovascular: Normal rate, regular rhythm, normal heart sounds and intact distal pulses.   Pulse 95-100  Pulmonary/Chest: Effort normal and breath sounds normal.  O2 saturation 98  Abdominal: Soft. Bowel sounds are normal. She exhibits no mass. There is no tenderness.  Musculoskeletal: Normal range of motion.  Lymphadenopathy:    She has no cervical adenopathy.  Neurological: She is alert and oriented to person, place, and time.  Skin: Skin is warm and dry. No rash noted.  Psychiatric: She has a normal mood and affect. Her behavior is normal.          Assessment & Plan:   Single episode PSVT.  Will check thyroid indices.  Total smoking cessation encouraged.  She'll moderate her caffeine use.  Will check 2-D echocardiogram.  Schedule cardiology evaluation Obesity.  Weight loss encouraged ADHD.  No change in therapy unless PSVT becomes more of an issue   Rogelia Boga, MD

## 2016-04-17 NOTE — Patient Instructions (Signed)
Paroxysmal Supraventricular Tachycardia °Paroxysmal supraventricular tachycardia (PSVT) is when your heart beats very quickly and then suddenly stops beating so quickly. You may or may not have any symptoms when this occurs. It is usually not dangerous. It can lead to problems if it happens often or it lasts a long time. °HOME CARE  °· Take medicines only as told by your doctor. °· Avoid caffeine or limit how much of it you consume as told by your doctor. Caffeine is found in coffee, tea, soda, and chocolate. °· Avoid alcohol or limit how much of it you drink as told by your doctor. °· Do not smoke. °· Try to get at least 7 hours of sleep each night. °· Find healthy ways to reduce stress. °· Do self-treatments as told by your doctor to slow down your heart (vagus nerve stimulation). Your doctor may tell you to: °¨ Hold your breath and push, as though you are going to the bathroom. °¨ Rub an area on one side of your neck. °¨ Bend forward with your head between your legs. °¨ Bend forward with your head between your legs, then cough. °¨ Rub your eyeballs with your eyes closed. °· Maintain a healthy weight. °· Get some exercise on most days. Ask your doctor about some good activities for you. °GET HELP IF: °· You are having episodes of a fast heartbeat more often. °· Your episodes are lasting longer. °· Your self-treatments to slow down your heart are no longer helping. °· You have new symptoms during an episode. °GET HELP RIGHT AWAY IF: °· You have chest pain. °· You have trouble breathing. °· You have an episode of a fast heartbeat that lasts longer than 20 minutes. °· You pass out (faint). °These symptoms may be an emergency. Do not wait to see if the symptoms will go away. Get medical help right away. Call your local emergency services (911 in the [place] Do not drive yourself to the hospital. °  °This information is not intended to replace advice given to you by your health care provider. Make sure you discuss any  questions you have with your health care provider. °  °Document Released: 11/04/2005 Document Revised: 11/25/2014 Document Reviewed: 04/14/2014 °Elsevier Interactive Patient Education ©2016 Elsevier Inc. ° °

## 2016-04-17 NOTE — Progress Notes (Signed)
Pre visit review using our clinic review tool, if applicable. No additional management support is needed unless otherwise documented below in the visit note. 

## 2016-04-20 ENCOUNTER — Other Ambulatory Visit: Payer: Self-pay | Admitting: Internal Medicine

## 2016-04-22 ENCOUNTER — Other Ambulatory Visit: Payer: Self-pay

## 2016-04-22 ENCOUNTER — Ambulatory Visit (HOSPITAL_COMMUNITY): Payer: 59 | Attending: Cardiology

## 2016-04-22 DIAGNOSIS — I471 Supraventricular tachycardia: Secondary | ICD-10-CM | POA: Diagnosis not present

## 2016-04-22 DIAGNOSIS — I517 Cardiomegaly: Secondary | ICD-10-CM | POA: Diagnosis not present

## 2016-04-22 DIAGNOSIS — E785 Hyperlipidemia, unspecified: Secondary | ICD-10-CM | POA: Insufficient documentation

## 2016-04-22 DIAGNOSIS — Z720 Tobacco use: Secondary | ICD-10-CM | POA: Insufficient documentation

## 2016-04-22 DIAGNOSIS — R002 Palpitations: Secondary | ICD-10-CM | POA: Diagnosis present

## 2016-04-22 LAB — ECHOCARDIOGRAM COMPLETE
CHL CUP DOP CALC LVOT VTI: 19.6 cm
E decel time: 187 msec
EERAT: 5.7
FS: 29 % (ref 28–44)
IV/PV OW: 0.73
LA ID, A-P, ES: 38 cm
LA diam end sys: 38 cm
LA vol: 46 cm3
LADIAMINDEX: 1.64 cm/m2
LAVOLA4C: 40 mL
LAVOLIN: 19.8 mL/m2
LV TDI E'LATERAL: 13.6
LV e' LATERAL: 13.6 cm/s
LVEEAVG: 5.7
LVEEMED: 5.7
LVOT SV: 74 cm3
LVOT area: 3.8 cm2
LVOT diameter: 22 cm
LVOT peak vel: 87.2 m/s
MV Dec: 187
MV Peak grad: 2 mmHg
MVPKAVEL: 67.1 m/s
MVPKEVEL: 77.5 m/s
PW: 13 mm — AB (ref 0.6–1.1)
TDI e' medial: 9.43

## 2016-04-23 ENCOUNTER — Ambulatory Visit (INDEPENDENT_AMBULATORY_CARE_PROVIDER_SITE_OTHER): Payer: 59 | Admitting: Internal Medicine

## 2016-04-23 ENCOUNTER — Encounter: Payer: Self-pay | Admitting: Internal Medicine

## 2016-04-23 VITALS — BP 144/92 | HR 107 | Ht 67.5 in | Wt 281.2 lb

## 2016-04-23 DIAGNOSIS — I471 Supraventricular tachycardia: Secondary | ICD-10-CM | POA: Diagnosis not present

## 2016-04-23 NOTE — Patient Instructions (Signed)

## 2016-04-23 NOTE — Progress Notes (Signed)
HPI The patient is a very pleasatn morbidly obese woman with a h/o anxiety who was on a cruise several weeks ago when she developed her first episode of SVT. She states that it felt like her heart was beating out of her chest, and she had associated chest pressure and sob. No syncope. [facility] and was fount to be in SVT at over 200/min. She was treated with IV adenosine after vagal maneuvers failed to terminate her arrhythmia and return to sinus tachycardia. The patient has had no episodes since then and is on no medications to prevent additional SVT.Marland Kitchen   No Known Allergies   Current Outpatient Prescriptions  Medication Sig Dispense Refill  . amphetamine-dextroamphetamine (ADDERALL XR) 20 MG 24 hr capsule Take 1 capsule (20 mg total) by mouth every morning. (Patient not taking: Reported on 04/23/2016) 30 capsule 0  . atorvastatin (LIPITOR) 40 MG tablet TAKE 1 TABLET (40 MG TOTAL) BY MOUTH DAILY. (Patient not taking: Reported on 04/23/2016) 90 tablet 3  . sertraline (ZOLOFT) 50 MG tablet Take 1 tablet (50 mg total) by mouth daily. (Patient not taking: Reported on 04/23/2016) 90 tablet 0   No current facility-administered medications for this visit.     Past Medical History  Diagnosis Date  . ADD 01/02/2009  . Headache(784.0) 01/02/2009  . HYPERLIPIDEMIA 01/02/2009  . TOBACCO ABUSE 01/02/2009  . Obesity     ROS:   All systems reviewed and negative except as noted in the HPI.   Past Surgical History  Procedure Laterality Date  . Wisdom tooth extraction       Family History  Problem Relation Age of Onset  . Diabetes Mother   . Hyperlipidemia Mother   . Birth defects Maternal Grandmother     breast, colon, brain ca     Social History   Social History  . Marital Status: Married    Spouse Name: N/A  . Number of Children: N/A  . Years of Education: N/A   Occupational History  . Not on file.   Social History Main Topics  . Smoking status: Current  Every Day Smoker -- 0.50 packs/day    Types: Cigarettes  . Smokeless tobacco: Never Used  . Alcohol Use: Yes     Comment: occasional  . Drug Use: No  . Sexual Activity: Not on file   Other Topics Concern  . Not on file   Social History Narrative     BP 144/92 mmHg  Pulse 107  Ht 5' 7.5" (1.715 m)  Wt 281 lb 3.2 oz (127.551 kg)  BMI 43.37 kg/m2  LMP 04/14/2016  Physical Exam:  Obese but otherwise well appearing NAD HEENT: Unremarkable Neck:  6 cm JVD, no thyromegally Lymphatics:  No adenopathy Back:  No CVA tenderness Lungs:  Clear with no wheezes HEART:  Regular rate rhythm, no murmurs, no rubs, no clicks Abd:  soft, obese, positive bowel sounds, no organomegally, no rebound, no guarding Ext:  2 plus pulses, no edema, no cyanosis, no clubbing Skin:  No rashes no nodules Neuro:  CN II through XII intact, motor grossly intact  EKG - NSR with no pre-excitation   Assess/Plan: 1. SVT - her symptoms are consistent with SVT. We do not have the ECG to review but the patient states that she has it at home and will bring it in to be scanned. I have recommended a period of watchful waiting. She is encouraged to avoid caffeine and ETOH. Also  to get plenty of sleep. We discussed her use of Adderall. Because she has not had other episodes of known SVT in 40 years, I think it would be reasonable to allow her to continue this medication. If she has more SVT, then we would have her stop the Adderall.  2. Obesity - she needs to lose weight. 3. Tobacco abuse - she needs to stop smoking.  Leonia ReevesGregg Taiwo Fish,M.D.

## 2016-04-26 ENCOUNTER — Telehealth: Payer: Self-pay | Admitting: Internal Medicine

## 2016-04-26 NOTE — Telephone Encounter (Signed)
See result notes. 

## 2016-04-26 NOTE — Telephone Encounter (Signed)
Pt returned your call.  

## 2016-05-10 ENCOUNTER — Other Ambulatory Visit: Payer: Self-pay

## 2016-06-06 ENCOUNTER — Telehealth: Payer: Self-pay | Admitting: Internal Medicine

## 2016-06-06 NOTE — Telephone Encounter (Signed)
Pt needs new rx lorazepam 1 mg send to [facility]. This med was prescibed when pt was in hospital about 18 month ago. Pt uses the med for panic attack. Pt is aware md out of office until monday

## 2016-06-07 NOTE — Telephone Encounter (Signed)
Please advise.   Lorazepam not in current med list.   Last Rx'd in 02/17/2014 Last Appt was 04/17/16 No upcoming appt.

## 2016-06-10 NOTE — Telephone Encounter (Signed)
Lorazepam 0.5 #30 one every 8 hours as needed for panic attack

## 2016-06-11 ENCOUNTER — Other Ambulatory Visit: Payer: Self-pay

## 2016-06-11 MED ORDER — ALPRAZOLAM 0.5 MG PO TABS: 0.5000 mg | ORAL_TABLET | Freq: Three times a day (TID) | ORAL | 0 refills | Status: DC | PRN

## 2016-06-11 NOTE — Telephone Encounter (Signed)
Prescription sent to [facility]. Patient made aware.

## 2016-07-23 ENCOUNTER — Telehealth: Payer: Self-pay | Admitting: Internal Medicine

## 2016-07-23 NOTE — Telephone Encounter (Signed)
Pt needs new rx generic adderall xr 20 mg °

## 2016-07-24 MED ORDER — AMPHETAMINE-DEXTROAMPHET ER 20 MG PO CP24: 20.0000 mg | ORAL_CAPSULE | Freq: Every day | ORAL | 0 refills | Status: DC

## 2016-07-24 MED ORDER — AMPHETAMINE-DEXTROAMPHET ER 20 MG PO CP24: 20.0000 mg | ORAL_CAPSULE | ORAL | 0 refills | Status: DC

## 2016-07-24 NOTE — Telephone Encounter (Signed)
Left message on voicemail Rx ready for pickup will be at the front desk. Any questions please call office.

## 2016-08-10 ENCOUNTER — Other Ambulatory Visit: Payer: Self-pay | Admitting: Internal Medicine

## 2016-08-19 ENCOUNTER — Encounter: Payer: Self-pay | Admitting: Family Medicine

## 2016-08-19 ENCOUNTER — Ambulatory Visit (INDEPENDENT_AMBULATORY_CARE_PROVIDER_SITE_OTHER): Payer: 59 | Admitting: Family Medicine

## 2016-08-19 VITALS — BP 120/80 | HR 92 | Temp 98.4°F | Resp 20 | Ht 67.5 in | Wt 279.4 lb

## 2016-08-19 DIAGNOSIS — R059 Cough, unspecified: Secondary | ICD-10-CM

## 2016-08-19 DIAGNOSIS — R05 Cough: Secondary | ICD-10-CM

## 2016-08-19 DIAGNOSIS — J321 Chronic frontal sinusitis: Secondary | ICD-10-CM | POA: Diagnosis not present

## 2016-08-19 MED ORDER — HYDROCODONE-HOMATROPINE 5-1.5 MG/5ML PO SYRP: 5.0000 mL | ORAL_SOLUTION | Freq: Three times a day (TID) | ORAL | 0 refills | Status: DC | PRN

## 2016-08-19 MED ORDER — AMOXICILLIN-POT CLAVULANATE 875-125 MG PO TABS: 1.0000 | ORAL_TABLET | Freq: Two times a day (BID) | ORAL | 0 refills | Status: DC

## 2016-08-19 NOTE — Progress Notes (Signed)
Subjective:    Patient ID: Angela Townsend, female    DOB: 06-Aug-1976, 40 y.o.   MRN: [id number]  HPI Angela Townsend is a 40 year old female who presents today a cough and cold symptoms for 2 weeks. Cough is productive with green sputum and is worse at night. Symptoms are not improving and have remained consistent for 2 weeks. Associated symptom of rhinitis with clear drainage, sore throat, post nasal drip, ear pressure, fatigue, and sinus pressure/pain. Denies fever, chills, sweats, N/V/D, history of asthma/bronchitis. No recent antibiotic therapy. + sick exposure with daughter. No aggravating or relieving factors noted Treatment at home includes robitussin and mucinex with limited benefit.  Review of Systems  Constitutional: Negative for chills and fever.  HENT: Positive for congestion, postnasal drip, rhinorrhea, sinus pressure and sore throat.   Respiratory: Positive for cough. Negative for shortness of breath and wheezing.   Cardiovascular: Negative for chest pain and palpitations.  Gastrointestinal: Negative for abdominal pain, diarrhea, nausea and vomiting.  Skin: Negative for rash.  Neurological: Negative for dizziness, light-headedness and headaches.   Past Medical History:  Diagnosis Date  . ADD 01/02/2009  . Headache(784.0) 01/02/2009  . HYPERLIPIDEMIA 01/02/2009  . Obesity   . TOBACCO ABUSE 01/02/2009     Social History   Social History  . Marital status: Married    Spouse name: N/A  . Number of children: N/A  . Years of education: N/A   Occupational History  . Not on file.   Social History Main Topics  . Smoking status: Current Every Day Smoker    Packs/day: 0.50    Types: Cigarettes  . Smokeless tobacco: Never Used  . Alcohol use Yes     Comment: occasional  . Drug use: No  . Sexual activity: Not on file   Other Topics Concern  . Not on file   Social History Narrative  . No narrative on file    Past Surgical History:  Procedure Laterality Date    . WISDOM TOOTH EXTRACTION      Family History  Problem Relation Age of Onset  . Diabetes Mother   . Hyperlipidemia Mother   . Birth defects Maternal Grandmother     breast, colon, brain ca    No Known Allergies  Current Outpatient Prescriptions on File Prior to Visit  Medication Sig Dispense Refill  . ALPRAZolam (XANAX) 0.5 MG tablet Take 1 tablet (0.5 mg total) by mouth 3 (three) times daily as needed for anxiety (panic attacks). 30 tablet 0  . amphetamine-dextroamphetamine (ADDERALL XR) 20 MG 24 hr capsule Take 1 capsule (20 mg total) by mouth every morning. 30 capsule 0  . amphetamine-dextroamphetamine (ADDERALL XR) 20 MG 24 hr capsule Take 1 capsule (20 mg total) by mouth daily. 30 capsule 0  . amphetamine-dextroamphetamine (ADDERALL XR) 20 MG 24 hr capsule Take 1 capsule (20 mg total) by mouth daily. 30 capsule 0  . atorvastatin (LIPITOR) 40 MG tablet TAKE 1 TABLET (40 MG TOTAL) BY MOUTH DAILY. 90 tablet 3  . sertraline (ZOLOFT) 50 MG tablet TAKE 1 TABLET (50 MG TOTAL) BY MOUTH DAILY. 90 tablet 0   No current facility-administered medications on file prior to visit.     BP 120/80 (BP Location: Left Arm, Patient Position: Sitting, Cuff Size: Large)   Pulse 92   Temp 98.4 F (36.9 C) (Oral)   Resp 20   Ht 5' 7.5" (1.715 m)   Wt 279 lb 6.1 oz (126.7 kg)   LMP 08/05/2016 (  Approximate)   SpO2 98%   BMI 43.11 kg/m       Objective:   Physical Exam  Constitutional: She is oriented to person, place, and time. She appears well-developed and well-nourished.  HENT:  Left Ear: Tympanic membrane normal.  Nose: Rhinorrhea present. Right sinus exhibits frontal sinus tenderness. Right sinus exhibits no maxillary sinus tenderness. Left sinus exhibits frontal sinus tenderness. Left sinus exhibits no maxillary sinus tenderness.  Mouth/Throat: Mucous membranes are normal. No oropharyngeal exudate or posterior oropharyngeal erythema.  Dull Right TM  Eyes: Pupils are equal, round, and  reactive to light. No scleral icterus.  Neck: Neck supple.  Cardiovascular: Normal rate and regular rhythm.   Pulmonary/Chest: Effort normal and breath sounds normal. She has no wheezes. She has no rales.  Abdominal: Soft. Bowel sounds are normal. There is no tenderness.  Lymphadenopathy:    She has no cervical adenopathy.  Neurological: She is alert and oriented to person, place, and time.  Skin: Skin is warm and dry. No rash noted.  Psychiatric: She has a normal mood and affect. Her behavior is normal. Judgment and thought content normal.       Assessment & Plan:  1. Frontal sinusitis, unspecified chronicity Duration of symptoms without improvement supports treatment for sinusitis. Advised patient to follow up if symptoms do not improve with treatment in 3 to 4 days, worsens, or she develops a fever >100.  Patient voiced understanding and agreed with plan. - amoxicillin-clavulanate (AUGMENTIN) 875-125 MG tablet; Take 1 tablet by mouth 2 (two) times daily.  Dispense: 20 tablet; Refill: 0  2. Cough Advised patient to focus on hydration and she can use Mucinex DM for cough as needed or hycodan for cough that is not controlled with Mucinex DM.  - HYDROcodone-homatropine (HYCODAN) 5-1.5 MG/5ML syrup; Take 5 mLs by mouth every 8 (eight) hours as needed for cough.  Dispense: 120 mL; Refill: 0  Roddie Mc, FNP-C

## 2016-08-19 NOTE — Patient Instructions (Signed)
Please take medication as directed and follow up if symptoms do not improve, worsen, or you develop a fever >100. It was a pleasure to see you today! Feel better soon!  Sinusitis, Adult Sinusitis is redness, soreness, and inflammation of the paranasal sinuses. Paranasal sinuses are air pockets within the bones of your face. They are located beneath your eyes, in the middle of your forehead, and above your eyes. In healthy paranasal sinuses, mucus is able to drain out, and air is able to circulate through them by way of your nose. However, when your paranasal sinuses are inflamed, mucus and air can become trapped. This can allow bacteria and other germs to grow and cause infection. Sinusitis can develop quickly and last only a short time (acute) or continue over a long period (chronic). Sinusitis that lasts for more than 12 weeks is considered chronic. CAUSES Causes of sinusitis include:  Allergies.  Structural abnormalities, such as displacement of the cartilage that separates your nostrils (deviated septum), which can decrease the air flow through your nose and sinuses and affect sinus drainage.  Functional abnormalities, such as when the small hairs (cilia) that line your sinuses and help remove mucus do not work properly or are not present. SIGNS AND SYMPTOMS Symptoms of acute and chronic sinusitis are the same. The primary symptoms are pain and pressure around the affected sinuses. Other symptoms include:  Upper toothache.  Earache.  Headache.  Bad breath.  Decreased sense of smell and taste.  A cough, which worsens when you are lying flat.  Fatigue.  Fever.  Thick drainage from your nose, which often is green and may contain pus (purulent).  Swelling and warmth over the affected sinuses. DIAGNOSIS Your health care provider will perform a physical exam. During your exam, your health care provider may perform any of the following to help determine if you have acute sinusitis  or chronic sinusitis:  Look in your nose for signs of abnormal growths in your nostrils (nasal polyps).  Tap over the affected sinus to check for signs of infection.  View the inside of your sinuses using an imaging device that has a light attached (endoscope). If your health care provider suspects that you have chronic sinusitis, one or more of the following tests may be recommended:  Allergy tests.  Nasal culture. A sample of mucus is taken from your nose, sent to a lab, and screened for bacteria.  Nasal cytology. A sample of mucus is taken from your nose and examined by your health care provider to determine if your sinusitis is related to an allergy. TREATMENT Most cases of acute sinusitis are related to a viral infection and will resolve on their own within 10 days. Sometimes, medicines are prescribed to help relieve symptoms of both acute and chronic sinusitis. These may include pain medicines, decongestants, nasal steroid sprays, or saline sprays. However, for sinusitis related to a bacterial infection, your health care provider will prescribe antibiotic medicines. These are medicines that will help kill the bacteria causing the infection. Rarely, sinusitis is caused by a fungal infection. In these cases, your health care provider will prescribe antifungal medicine. For some cases of chronic sinusitis, surgery is needed. Generally, these are cases in which sinusitis recurs more than 3 times per year, despite other treatments. HOME CARE INSTRUCTIONS  Drink plenty of water. Water helps thin the mucus so your sinuses can drain more easily.  Use a humidifier.  Inhale steam 3-4 times a day (for example, sit in the bathroom  with the shower running).  Apply a warm, moist washcloth to your face 3-4 times a day, or as directed by your health care provider.  Use saline nasal sprays to help moisten and clean your sinuses.  Take medicines only as directed by your health care provider.  If  you were prescribed either an antibiotic or antifungal medicine, finish it all even if you start to feel better. SEEK IMMEDIATE MEDICAL CARE IF:  You have increasing pain or severe headaches.  You have nausea, vomiting, or drowsiness.  You have swelling around your face.  You have vision problems.  You have a stiff neck.  You have difficulty breathing.   This information is not intended to replace advice given to you by your health care provider. Make sure you discuss any questions you have with your health care provider.   Document Released: 11/04/2005 Document Revised: 11/25/2014 Document Reviewed: 11/19/2011 Elsevier Interactive Patient Education [employer].

## 2016-08-19 NOTE — Progress Notes (Signed)
Pre visit review using our clinic review tool, if applicable. No additional management support is needed unless otherwise documented below in the visit note. 

## 2016-09-19 ENCOUNTER — Ambulatory Visit: Payer: 59

## 2016-10-21 ENCOUNTER — Encounter: Payer: Self-pay | Admitting: Internal Medicine

## 2016-10-21 ENCOUNTER — Ambulatory Visit (INDEPENDENT_AMBULATORY_CARE_PROVIDER_SITE_OTHER): Payer: 59 | Admitting: Internal Medicine

## 2016-10-21 VITALS — BP 110/80 | HR 94 | Temp 98.3°F | Ht 67.5 in | Wt 274.4 lb

## 2016-10-21 DIAGNOSIS — F410 Panic disorder [episodic paroxysmal anxiety] without agoraphobia: Secondary | ICD-10-CM | POA: Diagnosis not present

## 2016-10-21 DIAGNOSIS — F909 Attention-deficit hyperactivity disorder, unspecified type: Secondary | ICD-10-CM | POA: Diagnosis not present

## 2016-10-21 DIAGNOSIS — F411 Generalized anxiety disorder: Secondary | ICD-10-CM

## 2016-10-21 MED ORDER — AMPHETAMINE-DEXTROAMPHET ER 20 MG PO CP24: 20.0000 mg | ORAL_CAPSULE | ORAL | 0 refills | Status: DC

## 2016-10-21 MED ORDER — AMPHETAMINE-DEXTROAMPHET ER 20 MG PO CP24: 20.0000 mg | ORAL_CAPSULE | Freq: Every day | ORAL | 0 refills | Status: DC

## 2016-10-21 NOTE — Progress Notes (Signed)
Subjective:    Patient ID: Angela LentChristina M Townsend, female    DOB: Nov 03, 1976, 40 y.o.   MRN: [id number]  HPI  40 year old patient who is seen today in follow-up.  She has a history of PSVT that has not reoccurred.  She has seen cardiology.  In general doing quite well.  She does have a history of anxiety disorder and panic disorder and has done well on her present regimen Complaints today include some anxiety fatigue.  She also describes some chest tightness that occurs usually through the night.  She has had some headaches and some lightheadedness.  She does describe increase in her stress level. Still smoking tobacco products  Past Medical History:  Diagnosis Date  . ADD 01/02/2009  . Headache(784.0) 01/02/2009  . HYPERLIPIDEMIA 01/02/2009  . Obesity   . TOBACCO ABUSE 01/02/2009     Social History   Social History  . Marital status: Married    Spouse name: N/A  . Number of children: N/A  . Years of education: N/A   Occupational History  . Not on file.   Social History Main Topics  . Smoking status: Current Every Day Smoker    Packs/day: 0.50    Types: Cigarettes  . Smokeless tobacco: Never Used  . Alcohol use Yes     Comment: occasional  . Drug use: No  . Sexual activity: Not on file   Other Topics Concern  . Not on file   Social History Narrative  . No narrative on file    Past Surgical History:  Procedure Laterality Date  . WISDOM TOOTH EXTRACTION      Family History  Problem Relation Age of Onset  . Diabetes Mother   . Hyperlipidemia Mother   . Birth defects Maternal Grandmother     breast, colon, brain ca    No Known Allergies  Current Outpatient Prescriptions on File Prior to Visit  Medication Sig Dispense Refill  . ALPRAZolam (XANAX) 0.5 MG tablet Take 1 tablet (0.5 mg total) by mouth 3 (three) times daily as needed for anxiety (panic attacks). 30 tablet 0  . amphetamine-dextroamphetamine (ADDERALL XR) 20 MG 24 hr capsule Take 1 capsule (20 mg  total) by mouth every morning. 30 capsule 0  . amphetamine-dextroamphetamine (ADDERALL XR) 20 MG 24 hr capsule Take 1 capsule (20 mg total) by mouth daily. 30 capsule 0  . amphetamine-dextroamphetamine (ADDERALL XR) 20 MG 24 hr capsule Take 1 capsule (20 mg total) by mouth daily. 30 capsule 0  . atorvastatin (LIPITOR) 40 MG tablet TAKE 1 TABLET (40 MG TOTAL) BY MOUTH DAILY. 90 tablet 3  . sertraline (ZOLOFT) 50 MG tablet TAKE 1 TABLET (50 MG TOTAL) BY MOUTH DAILY. 90 tablet 0   No current facility-administered medications on file prior to visit.     BP 110/80 (BP Location: Right Arm, Patient Position: Sitting, Cuff Size: Large)   Pulse 94   Temp 98.3 F (36.8 C) (Oral)   Ht 5' 7.5" (1.715 m)   Wt 274 lb 6.1 oz (124.5 kg)   SpO2 98%   BMI 42.34 kg/m     Review of Systems  Constitutional: Positive for fatigue.  HENT: Negative for congestion, dental problem, hearing loss, rhinorrhea, sinus pressure, sore throat and tinnitus.   Eyes: Negative for pain, discharge and visual disturbance.  Respiratory: Negative for cough and shortness of breath.   Cardiovascular: Positive for chest pain. Negative for palpitations and leg swelling.  Gastrointestinal: Negative for abdominal distention, abdominal pain, blood in  stool, constipation, diarrhea, nausea and vomiting.  Genitourinary: Negative for difficulty urinating, dysuria, flank pain, frequency, hematuria, pelvic pain, urgency, vaginal bleeding, vaginal discharge and vaginal pain.  Musculoskeletal: Negative for arthralgias, gait problem and joint swelling.  Skin: Negative for rash.  Neurological: Positive for light-headedness and headaches. Negative for dizziness, syncope, speech difficulty, weakness and numbness.  Hematological: Negative for adenopathy.  Psychiatric/Behavioral: Negative for agitation, behavioral problems and dysphoric mood. The patient is not nervous/anxious.        Objective:   Physical Exam  Constitutional: She is  oriented to person, place, and time. She appears well-developed and well-nourished.  Blood pressure well controlled Weight 275  HENT:  Head: Normocephalic.  Right Ear: External ear normal.  Left Ear: External ear normal.  Mouth/Throat: Oropharynx is clear and moist.  Eyes: Conjunctivae and EOM are normal. Pupils are equal, round, and reactive to light.  Neck: Normal range of motion. Neck supple. No thyromegaly present.  Cardiovascular: Normal rate, regular rhythm, normal heart sounds and intact distal pulses.   Pulmonary/Chest: Effort normal and breath sounds normal. No respiratory distress. She has no wheezes. She has no rales.  Abdominal: Soft. Bowel sounds are normal. She exhibits no mass. There is no tenderness.  Musculoskeletal: Normal range of motion.  Lymphadenopathy:    She has no cervical adenopathy.  Neurological: She is alert and oriented to person, place, and time.  Skin: Skin is warm and dry. No rash noted.  Psychiatric: She has a normal mood and affect. Her behavior is normal.          Assessment & Plan:   Morbid obesity Preventive health.  Flu vaccine, declined Anxiety disorder.  Continue sertraline and when necessary alprazolam PSVT.  Stable Dyslipidemia.  Continue statin therapy ADHD Tobacco abuse  Follow-up 6 months or as needed  Total smoking cessation encouraged  Medications updated  Rogelia BogaKWIATKOWSKI,[name]

## 2016-10-21 NOTE — Patient Instructions (Signed)
It is important that you exercise regularly, at least 20 minutes 3 to 4 times per week.  If you develop chest pain or shortness of breath seek  medical attention.  You need to lose weight.  Consider a lower calorie diet and regular exercise.  Smoking tobacco is very bad for your health. You should stop smoking immediately.  Return in 6 months for follow-up

## 2016-11-13 ENCOUNTER — Encounter: Payer: Self-pay | Admitting: Internal Medicine

## 2016-11-21 DIAGNOSIS — Z1231 Encounter for screening mammogram for malignant neoplasm of breast: Secondary | ICD-10-CM | POA: Diagnosis not present

## 2016-11-21 DIAGNOSIS — Z01419 Encounter for gynecological examination (general) (routine) without abnormal findings: Secondary | ICD-10-CM | POA: Diagnosis not present

## 2016-11-21 DIAGNOSIS — Z124 Encounter for screening for malignant neoplasm of cervix: Secondary | ICD-10-CM | POA: Diagnosis not present

## 2016-11-21 LAB — HM PAP SMEAR: HM Pap smear: ABNORMAL

## 2016-11-27 ENCOUNTER — Encounter: Payer: Self-pay | Admitting: Internal Medicine

## 2016-11-30 ENCOUNTER — Other Ambulatory Visit: Payer: Self-pay | Admitting: Internal Medicine

## 2016-12-03 ENCOUNTER — Other Ambulatory Visit: Payer: Self-pay | Admitting: Internal Medicine

## 2016-12-03 MED ORDER — ALPRAZOLAM 0.5 MG PO TABS: 0.5000 mg | ORAL_TABLET | Freq: Three times a day (TID) | ORAL | 0 refills | Status: DC | PRN

## 2016-12-10 ENCOUNTER — Other Ambulatory Visit: Payer: Self-pay | Admitting: Internal Medicine

## 2016-12-25 DIAGNOSIS — R87619 Unspecified abnormal cytological findings in specimens from cervix uteri: Secondary | ICD-10-CM | POA: Insufficient documentation

## 2016-12-25 DIAGNOSIS — N879 Dysplasia of cervix uteri, unspecified: Secondary | ICD-10-CM | POA: Diagnosis not present

## 2016-12-25 DIAGNOSIS — N870 Mild cervical dysplasia: Secondary | ICD-10-CM | POA: Diagnosis not present

## 2016-12-26 ENCOUNTER — Encounter: Payer: Self-pay | Admitting: Internal Medicine

## 2017-01-06 ENCOUNTER — Telehealth: Payer: Self-pay | Admitting: Emergency Medicine

## 2017-01-06 NOTE — Telephone Encounter (Signed)
Left a voicemail for pt Per Dr. Tawanna Coolerodd pt needs to go to the ED if she is having severe stomach pain.

## 2017-01-07 ENCOUNTER — Encounter: Payer: Self-pay | Admitting: Family Medicine

## 2017-01-07 ENCOUNTER — Ambulatory Visit (INDEPENDENT_AMBULATORY_CARE_PROVIDER_SITE_OTHER): Payer: 59 | Admitting: Family Medicine

## 2017-01-07 VITALS — BP 118/80 | HR 101 | Temp 97.8°F | Ht 67.0 in | Wt 269.6 lb

## 2017-01-07 DIAGNOSIS — R1011 Right upper quadrant pain: Secondary | ICD-10-CM | POA: Diagnosis not present

## 2017-01-07 LAB — CBC WITH DIFFERENTIAL/PLATELET
Basophils Absolute: 0 10*3/uL (ref 0.0–0.1)
Basophils Relative: 0.5 % (ref 0.0–3.0)
Eosinophils Absolute: 0.2 10*3/uL (ref 0.0–0.7)
Eosinophils Relative: 2.3 % (ref 0.0–5.0)
HCT: 41.7 % (ref 36.0–46.0)
Hemoglobin: 13.8 g/dL (ref 12.0–15.0)
Lymphocytes Relative: 23.4 % (ref 12.0–46.0)
Lymphs Abs: 1.7 10*3/uL (ref 0.7–4.0)
MCHC: 33 g/dL (ref 30.0–36.0)
MCV: 86.9 fl (ref 78.0–100.0)
Monocytes Absolute: 0.3 10*3/uL (ref 0.1–1.0)
Monocytes Relative: 3.6 % (ref 3.0–12.0)
Neutro Abs: 5.2 10*3/uL (ref 1.4–7.7)
Neutrophils Relative %: 70.2 % (ref 43.0–77.0)
Platelets: 219 10*3/uL (ref 150.0–400.0)
RBC: 4.8 Mil/uL (ref 3.87–5.11)
RDW: 14.3 % (ref 11.5–15.5)
WBC: 7.4 10*3/uL (ref 4.0–10.5)

## 2017-01-07 LAB — HEPATIC FUNCTION PANEL
ALT: 23 U/L (ref 0–35)
AST: 18 U/L (ref 0–37)
Albumin: 4 g/dL (ref 3.5–5.2)
Alkaline Phosphatase: 62 U/L (ref 39–117)
Bilirubin, Direct: 0.2 mg/dL (ref 0.0–0.3)
Total Bilirubin: 1.2 mg/dL (ref 0.2–1.2)
Total Protein: 6.8 g/dL (ref 6.0–8.3)

## 2017-01-07 LAB — BASIC METABOLIC PANEL
BUN: 9 mg/dL (ref 6–23)
CO2: 27 mEq/L (ref 19–32)
Calcium: 9.1 mg/dL (ref 8.4–10.5)
Chloride: 105 mEq/L (ref 96–112)
Creatinine, Ser: 0.83 mg/dL (ref 0.40–1.20)
GFR: 81.3 mL/min (ref >60.00)
Glucose, Bld: 131 mg/dL — ABNORMAL HIGH (ref 70–99)
Potassium: 3.9 mEq/L (ref 3.5–5.1)
Sodium: 139 mEq/L (ref 135–145)

## 2017-01-07 LAB — LIPASE: Lipase: 10 U/L — ABNORMAL LOW (ref 11.0–59.0)

## 2017-01-07 LAB — AMYLASE: Amylase: 22 U/L — ABNORMAL LOW (ref 27–131)

## 2017-01-07 NOTE — Patient Instructions (Addendum)
Complete fat free diet  Labs today  We'll set you up for an ultrasound ASAP  Set up a time 2 days after the ultrasound to see Raynelle FanningJulie for follow-up  If in the process of your evaluation you have severe abdominal pain or develop fever chills [facility]

## 2017-01-07 NOTE — Progress Notes (Signed)
Angela Townsend is a 41 year old married female G2 P2 who comes in today for evaluation of abdominal pain for 1 month  She called yesterday be seen with acute abdominal pain however on further questioning it's been chronic for a month.  She describes the pain is midepigastric to the right upper quadrant area that occurs about a half hour to an hour after she eats. On Sunday she had about a severe pain and vomited 1. She's had no fever chills or diarrhea. She says she is constipated. The discomfort and going on and off for the past month. Discomforts decreased by Pepto-Bismol and increased by eating fatty foods  Family history pertinent mother [id number] has diabetes liver disease and hiatal hernia. Dads in good health one brother one sister in good health. No family history of gallbladder disease.  LMP 3 weeks ago normal for her birth control husbands had a vasectomy.  She describes the pain is dull, a 3 on a scale of 1-10, in his seems to radiate up into the midepigastric area.  She is an ideal candidate to have gallbladder disease. She is overweight, has had 2 children, is approaching 42, and has fairly classic symptoms of gallbladder dysfunction.  She's not a smoker nor drinker    Medicines she takes Adderall 20 mg for adult ADD Lipitor for hyperlipidemia Zoloft for mild depression and she's been taking more Xanax to help her sleep. I cautioned her about that.  BP 118/80 (BP Location: Right Arm, Patient Position: Sitting, Cuff Size: Normal)   Pulse (!) 101   Temp 97.8 F (36.6 C) (Oral)   Ht 5\' 7"  (1.702 m)   Wt 269 lb 9.6 oz (122.3 kg)   BMI 42.23 kg/m  General she is a well-developed overweight female no acute distress in the supine position the abdomen is markedly enlarged because she is obese. Bowel sounds normal. Liver spleen kidneys not palpable however there is tenderness in the right upper quadrant with no rebound.  Impression,,,,,,, intermittent abdominal pain for the past month  associated with eating especially greasy or fatty foods symptoms consistent with gallbladder dysfunction  Plan......... fat-free diet....... labs today....... ultrasound of her gallbladder ASAP.Marland Kitchen.Marland Kitchen..Marland Kitchen

## 2017-01-07 NOTE — Progress Notes (Signed)
Pre visit review using our clinic review tool, if applicable. No additional management support is needed unless otherwise documented below in the visit note. 

## 2017-01-10 ENCOUNTER — Telehealth: Payer: Self-pay | Admitting: Internal Medicine

## 2017-01-10 NOTE — Telephone Encounter (Signed)
Pt would like blood work results. Pt is aware dr todd and dr Amador Cunaskwiatkowski out of office today

## 2017-01-12 DIAGNOSIS — Z13220 Encounter for screening for lipoid disorders: Secondary | ICD-10-CM | POA: Diagnosis not present

## 2017-01-12 DIAGNOSIS — Z713 Dietary counseling and surveillance: Secondary | ICD-10-CM | POA: Diagnosis not present

## 2017-01-12 DIAGNOSIS — Z136 Encounter for screening for cardiovascular disorders: Secondary | ICD-10-CM | POA: Diagnosis not present

## 2017-01-13 NOTE — Telephone Encounter (Signed)
Spoke with pt and informed her of her lab results. Pt verbalized understanding.  

## 2017-01-13 NOTE — Telephone Encounter (Signed)
See message below, please advise. -Angela Townsend 

## 2017-01-13 NOTE — Telephone Encounter (Signed)
Labs all OK except glucose of 131 (not sure if fasting).

## 2017-01-28 ENCOUNTER — Other Ambulatory Visit: Payer: 59

## 2017-02-05 ENCOUNTER — Other Ambulatory Visit: Payer: 59

## 2017-02-05 ENCOUNTER — Ambulatory Visit (INDEPENDENT_AMBULATORY_CARE_PROVIDER_SITE_OTHER): Payer: 59 | Admitting: Psychology

## 2017-02-05 DIAGNOSIS — F411 Generalized anxiety disorder: Secondary | ICD-10-CM | POA: Diagnosis not present

## 2017-02-10 ENCOUNTER — Other Ambulatory Visit: Payer: Self-pay | Admitting: Internal Medicine

## 2017-02-11 ENCOUNTER — Ambulatory Visit
Admission: RE | Admit: 2017-02-11 | Discharge: 2017-02-11 | Disposition: A | Discharge status: Home / Self Care | Payer: 59 | Source: Ambulatory Visit | Attending: Family Medicine | Admitting: Family Medicine

## 2017-02-11 ENCOUNTER — Other Ambulatory Visit: Payer: Self-pay | Admitting: Internal Medicine

## 2017-02-11 DIAGNOSIS — R1011 Right upper quadrant pain: Secondary | ICD-10-CM | POA: Diagnosis not present

## 2017-02-11 MED ORDER — AMPHETAMINE-DEXTROAMPHET ER 20 MG PO CP24
20.0000 mg | ORAL_CAPSULE | ORAL | 0 refills | Status: DC
Start: 2017-02-11 — End: 2017-07-15

## 2017-02-11 MED ORDER — ALPRAZOLAM 0.5 MG PO TABS: 0.5000 mg | ORAL_TABLET | Freq: Three times a day (TID) | ORAL | 0 refills | Status: DC | PRN

## 2017-02-11 MED ORDER — AMPHETAMINE-DEXTROAMPHET ER 20 MG PO CP24: 20.0000 mg | ORAL_CAPSULE | Freq: Every day | ORAL | 0 refills | Status: DC

## 2017-02-11 MED ORDER — AMPHETAMINE-DEXTROAMPHET ER 20 MG PO CP24
20.0000 mg | ORAL_CAPSULE | Freq: Every day | ORAL | 0 refills | Status: DC
Start: 2017-02-11 — End: 2017-07-15

## 2017-02-13 ENCOUNTER — Other Ambulatory Visit: Payer: 59

## 2017-03-04 ENCOUNTER — Encounter: Payer: Self-pay | Admitting: Internal Medicine

## 2017-04-28 ENCOUNTER — Ambulatory Visit (INDEPENDENT_AMBULATORY_CARE_PROVIDER_SITE_OTHER): Payer: 59 | Admitting: Internal Medicine

## 2017-04-28 ENCOUNTER — Encounter: Payer: Self-pay | Admitting: Internal Medicine

## 2017-04-28 VITALS — BP 116/66 | HR 112 | Temp 98.2°F | Ht 67.0 in | Wt 266.6 lb

## 2017-04-28 DIAGNOSIS — F17200 Nicotine dependence, unspecified, uncomplicated: Secondary | ICD-10-CM

## 2017-04-28 DIAGNOSIS — J029 Acute pharyngitis, unspecified: Secondary | ICD-10-CM

## 2017-04-28 DIAGNOSIS — E785 Hyperlipidemia, unspecified: Secondary | ICD-10-CM | POA: Diagnosis not present

## 2017-04-28 NOTE — Patient Instructions (Addendum)
WE NOW OFFER   [facility] FAST TRACK!!!  SAME DAY Appointments for ACUTE CARE  Such as: Sprains, Injuries, cuts, abrasions, rashes, muscle pain, joint pain, back pain Colds, flu, sore throats, headache, allergies, cough, fever  Ear pain, sinus and eye infections Abdominal pain, nausea, vomiting, diarrhea, upset stomach Animal/insect bites  3 Easy Ways to Schedule: Walk-In Scheduling Call in scheduling Mychart Sign-up: [email or web address]   Use a once a day nonsedating antihistamine such as Claritin or Allegra  Throat lozenges as needed  Take 400-600 mg of ibuprofen ( Advil, Motrin) with food every 4 to 6 hours as needed for pain relief

## 2017-04-28 NOTE — Progress Notes (Signed)
Subjective:    Patient ID: Angela Townsend, female    DOB: [birth year], 41 y.o.   MRN: [id number]  HPI 41 year old patient who presents with a chief complaint of sore throat.  She is also noticed some intermittent left ear pain with head movement. She is a low volumes smoker and has cut down to 5 cigarettes daily. She does describe some mild chronic postnasal drip. She has been slightly anxious about possible oral cancer due to her smoking history  . Past Medical History:  Diagnosis Date  . ADD 01/02/2009  . Headache(784.0) 01/02/2009  . HYPERLIPIDEMIA 01/02/2009  . Obesity   . TOBACCO ABUSE 01/02/2009     Social History   Social History  . Marital status: Married    Spouse name: N/A  . Number of children: N/A  . Years of education: N/A   Occupational History  . Not on file.   Social History Main Topics  . Smoking status: Current Every Day Smoker    Packs/day: 0.50    Types: Cigarettes  . Smokeless tobacco: Never Used  . Alcohol use Yes     Comment: occasional  . Drug use: No  . Sexual activity: Not on file   Other Topics Concern  . Not on file   Social History Narrative  . No narrative on file    Past Surgical History:  Procedure Laterality Date  . WISDOM TOOTH EXTRACTION      Family History  Problem Relation Age of Onset  . Diabetes Mother   . Hyperlipidemia Mother   . Birth defects Maternal Grandmother        breast, colon, brain ca    No Known Allergies  Current Outpatient Prescriptions on File Prior to Visit  Medication Sig Dispense Refill  . ALPRAZolam (XANAX) 0.5 MG tablet Take 1 tablet (0.5 mg total) by mouth 3 (three) times daily as needed for anxiety (panic attacks). 30 tablet 0  . amphetamine-dextroamphetamine (ADDERALL XR) 20 MG 24 hr capsule Take 1 capsule (20 mg total) by mouth daily. 30 capsule 0  . amphetamine-dextroamphetamine (ADDERALL XR) 20 MG 24 hr capsule Take 1 capsule (20 mg total) by mouth daily. 30 capsule 0  .  amphetamine-dextroamphetamine (ADDERALL XR) 20 MG 24 hr capsule Take 1 capsule (20 mg total) by mouth every morning. 30 capsule 0  . atorvastatin (LIPITOR) 40 MG tablet TAKE 1 TABLET (40 MG TOTAL) BY MOUTH DAILY. 90 tablet 3  . sertraline (ZOLOFT) 50 MG tablet TAKE 1 TABLET BY MOUTH EVERY DAY 90 tablet 1   No current facility-administered medications on file prior to visit.     BP 116/66 (BP Location: Left Arm, Patient Position: Sitting, Cuff Size: Large)   Pulse (!) 112   Temp 98.2 F (36.8 C) (Oral)   Ht 5\' 7"  (1.702 m)   Wt 266 lb 9.6 oz (120.9 kg)   LMP 04/14/2017 (Approximate)   SpO2 95%   BMI 41.76 kg/m      Review of Systems  Constitutional: Negative.   HENT: Positive for postnasal drip and sore throat. Negative for congestion, dental problem, hearing loss, rhinorrhea, sinus pressure and tinnitus.   Eyes: Negative for pain, discharge and visual disturbance.  Respiratory: Negative for cough and shortness of breath.   Cardiovascular: Negative for chest pain, palpitations and leg swelling.  Gastrointestinal: Negative for abdominal distention, abdominal pain, blood in stool, constipation, diarrhea, nausea and vomiting.  Genitourinary: Negative for difficulty urinating, dysuria, flank pain, frequency, hematuria, pelvic pain, urgency, vaginal  bleeding, vaginal discharge and vaginal pain.  Musculoskeletal: Negative for arthralgias, gait problem and joint swelling.  Skin: Negative for rash.  Neurological: Negative for dizziness, syncope, speech difficulty, weakness, numbness and headaches.  Hematological: Negative for adenopathy.  Psychiatric/Behavioral: Negative for agitation, behavioral problems and dysphoric mood. The patient is not nervous/anxious.        Objective:   Physical Exam  Constitutional: She is oriented to person, place, and time. She appears well-developed and well-nourished.  HENT:  Head: Normocephalic.  Right Ear: External ear normal.  Left Ear: External  ear normal.  Mouth/Throat: Oropharynx is clear and moist.  Pharyngeal crowding with low hanging soft palate Otherwise unremarkable  Eyes: Conjunctivae and EOM are normal. Pupils are equal, round, and reactive to light.  Neck: Normal range of motion. Neck supple. No thyromegaly present.  Cardiovascular: Normal rate, regular rhythm, normal heart sounds and intact distal pulses.   Pulmonary/Chest: Effort normal and breath sounds normal.  Abdominal: Soft. Bowel sounds are normal. She exhibits no mass. There is no tenderness.  Musculoskeletal: Normal range of motion.  Lymphadenopathy:    She has no cervical adenopathy.  Neurological: She is alert and oriented to person, place, and time.  Skin: Skin is warm and dry. No rash noted.  Psychiatric: She has a normal mood and affect. Her behavior is normal.          Assessment & Plan:   Sore throat OSA suspect.  The possibility of a home sleep study discussed.  She will discuss with her husband.  She does not consider herself a loud snorer and denies much in the way of daytime drowsiness  Total smoking cessation encouraged  Rogelia BogaKWIATKOWSKI,Ellias Mcelreath FRANK

## 2017-05-12 ENCOUNTER — Other Ambulatory Visit: Payer: Self-pay | Admitting: Internal Medicine

## 2017-06-04 ENCOUNTER — Other Ambulatory Visit: Payer: Self-pay | Admitting: Internal Medicine

## 2017-06-04 NOTE — Telephone Encounter (Signed)
Last refill 02/11/17, #30, 0 refills  Please advise. Thanks!

## 2017-06-06 MED ORDER — ALPRAZOLAM 0.5 MG PO TABS: 0.5000 mg | ORAL_TABLET | Freq: Three times a day (TID) | ORAL | 0 refills | Status: DC | PRN

## 2017-06-06 NOTE — Telephone Encounter (Signed)
Okay for refill?  

## 2017-06-06 NOTE — Telephone Encounter (Signed)
Rx phoned into pharmacy.

## 2017-07-15 ENCOUNTER — Ambulatory Visit (INDEPENDENT_AMBULATORY_CARE_PROVIDER_SITE_OTHER): Payer: 59 | Admitting: Internal Medicine

## 2017-07-15 ENCOUNTER — Encounter: Payer: Self-pay | Admitting: Internal Medicine

## 2017-07-15 VITALS — BP 118/78 | HR 97 | Ht 68.0 in | Wt 274.2 lb

## 2017-07-15 DIAGNOSIS — I471 Supraventricular tachycardia: Secondary | ICD-10-CM

## 2017-07-15 NOTE — Progress Notes (Signed)
      HPI Angela Townsend returns today for follow-up. She is a very pleasant 41 year old morbidly obese woman who I saw last for evaluation of SVT. In the interim she has done well with no sustained episodes of SVT. She has not had to seek any medical attention. She has occasional episodes lasting for just a few seconds to a minute. She is able to control them by straining. No peripheral edema. No chest pain or shortness of breath. No Known Allergies   Current Outpatient Prescriptions  Medication Sig Dispense Refill  . ALPRAZolam (XANAX) 0.5 MG tablet Take 1 tablet (0.5 mg total) by mouth 3 (three) times daily as needed for anxiety (panic attacks). 30 tablet 0  . atorvastatin (LIPITOR) 40 MG tablet TAKE 1 TABLET (40 MG TOTAL) BY MOUTH DAILY. 90 tablet 1  . Melatonin 5 MG TABS Take 5 mg by mouth at bedtime as needed (sleep).    . sertraline (ZOLOFT) 50 MG tablet TAKE 1 TABLET BY MOUTH EVERY DAY 90 tablet 1   No current facility-administered medications for this visit.      Past Medical History:  Diagnosis Date  . ADD 01/02/2009  . Headache(784.0) 01/02/2009  . HYPERLIPIDEMIA 01/02/2009  . Obesity   . TOBACCO ABUSE 01/02/2009    ROS:   All systems reviewed and negative except as noted in the HPI.   Past Surgical History:  Procedure Laterality Date  . WISDOM TOOTH EXTRACTION       Family History  Problem Relation Age of Onset  . Diabetes Mother   . Hyperlipidemia Mother   . Birth defects Maternal Grandmother        breast, colon, brain ca     Social History   Social History  . Marital status: Married    Spouse name: N/A  . Number of children: N/A  . Years of education: N/A   Occupational History  . Not on file.   Social History Main Topics  . Smoking status: Current Every Day Smoker    Packs/day: 0.50    Types: Cigarettes  . Smokeless tobacco: Never Used  . Alcohol use Yes     Comment: occasional  . Drug use: No  . Sexual activity: Not on file   Other  Topics Concern  . Not on file   Social History Narrative  . No narrative on file     BP 118/78   Pulse 97   Ht 5\' 8"  (1.727 m)   Wt 274 lb 3.2 oz (124.4 kg)   SpO2 98%   BMI 41.69 kg/m   Physical Exam:  Well appearing Obese, young woman, NAD HEENT: Unremarkable Neck:  6 cm JVD, no thyromegally Lymphatics:  No adenopathy Back:  No CVA tenderness Lungs:  Clear, with no wheezes, rales, or rhonchi. HEART:  Regular rate rhythm, no murmurs, no rubs, no clicks Abd:  soft, positive bowel sounds, no organomegally, no rebound, no guarding Ext:  2 plus pulses, no edema, no cyanosis, no clubbing Skin:  No rashes no nodules Neuro:  CN II through XII intact, motor grossly intact  EKG - normal sinus rhythm with sinus arrhythmia  Assess/Plan: 1. SVT - she has had no sustained episodes. I recommended watchful waiting. She is encouraged to limit caffeine intake and alcohol intake and to get plenty of rest. 2. Tobacco abuse - she is encouraged to stop smoking.  Lewayne Bunting, M.D.

## 2017-07-15 NOTE — Patient Instructions (Addendum)
Medication Instructions:  ?Your physician recommends that you continue on your current medications as directed. Please refer to the Current Medication list given to you today. ? ?Labwork: ?None ordered. ? ?Testing/Procedures: ?None ordered. ? ?Follow-Up: ?Your physician wants you to follow-up as needed ? ? ?Any Other Special Instructions Will Be Listed Below (If Applicable). ? ?If you need a refill on your cardiac medications before your next appointment, please call your pharmacy.  ? ? ? ? ?

## 2017-08-07 ENCOUNTER — Other Ambulatory Visit: Payer: Self-pay | Admitting: Internal Medicine

## 2017-08-07 ENCOUNTER — Encounter: Payer: Self-pay | Admitting: Internal Medicine

## 2017-08-07 NOTE — Telephone Encounter (Signed)
Left a Vm for patient to give the office a call back regarding making an appointment for medication management. Sertraline has been sent to the pharmacy with a 90 day supply.

## 2017-08-14 ENCOUNTER — Other Ambulatory Visit: Payer: Self-pay | Admitting: Internal Medicine

## 2017-08-14 MED ORDER — ALPRAZOLAM 0.5 MG PO TABS: 0.5000 mg | ORAL_TABLET | Freq: Three times a day (TID) | ORAL | 0 refills | Status: DC | PRN

## 2017-09-03 ENCOUNTER — Encounter: Payer: Self-pay | Admitting: Internal Medicine

## 2017-09-11 ENCOUNTER — Other Ambulatory Visit: Payer: Self-pay | Admitting: Internal Medicine

## 2017-09-11 ENCOUNTER — Telehealth: Payer: Self-pay | Admitting: Internal Medicine

## 2017-09-11 MED ORDER — AMPHETAMINE-DEXTROAMPHET ER 20 MG PO CP24: 20.0000 mg | ORAL_CAPSULE | ORAL | 0 refills | Status: DC

## 2017-09-11 NOTE — Telephone Encounter (Signed)
Copied from CRM #[id number]. Topic: General - Other >> Sep 09, 2017  5:12 PM Suggs, Tammy E wrote: Reason for CRM: Patient is requesting a refill on generic Adderall.  Patient states Dr Kirtland BouchardK said on the portal she could drop by the office and pick it up, however I didn't see it in the drawer.  Can patient get this as soon as possible?

## 2017-09-11 NOTE — Telephone Encounter (Signed)
Please advise dose, sig, and quantity for Adderall. [place] [facility] [place] [facility] as I will be out of the office this afternoon.

## 2017-09-11 NOTE — Telephone Encounter (Signed)
Left a message on pt's voicemail stating that her Adderall Rx's are ready to be picked up at the front desk.

## 2017-11-08 ENCOUNTER — Other Ambulatory Visit: Payer: Self-pay | Admitting: Internal Medicine

## 2017-11-09 ENCOUNTER — Other Ambulatory Visit: Payer: Self-pay | Admitting: Internal Medicine

## 2018-01-06 ENCOUNTER — Other Ambulatory Visit: Payer: Self-pay | Admitting: Internal Medicine

## 2018-01-08 ENCOUNTER — Other Ambulatory Visit: Payer: Self-pay | Admitting: Internal Medicine

## 2018-01-08 NOTE — Telephone Encounter (Signed)
Copied from CRM [id number]. Topic: Quick Communication - Rx Refill/Question >> Jan 08, 2018  4:44 PM Floria RavelingStovall, Shana A wrote: Medication: ALPRAZolam Prudy Feeler(XANAX) 0.5 MG tablet [[id number]][[id number]]   Has the patient contacted their pharmacy? No    (Agent: If no, request that the patient contact the pharmacy for the refill.)   Preferred Pharmacy (with phone number or street name):  [facility] [address] or [facility]    Agent: Please be advised that RX refills may take up to 3 business days. We ask that you follow-up with your pharmacy.

## 2018-01-09 NOTE — Telephone Encounter (Signed)
Last OV 10/21/16 where this was addressed.  Dr. Amador CunasKwiatkowski  [facility] [address] or pt picks it up at office.

## 2018-01-12 MED ORDER — AMPHETAMINE-DEXTROAMPHET ER 20 MG PO CP24: 20.0000 mg | ORAL_CAPSULE | ORAL | 0 refills | Status: DC

## 2018-01-12 NOTE — Telephone Encounter (Signed)
Rx filed at front desk for pick up. Left message notifying patient.

## 2018-01-12 NOTE — Telephone Encounter (Signed)
Pt is out of her meds. Please call when ready for pick up. [phone]

## 2018-01-12 NOTE — Telephone Encounter (Signed)
Duplicate request. 01/06/18 request for same medication sent to PCP.

## 2018-01-12 NOTE — Telephone Encounter (Signed)
Okay for medication refill 

## 2018-01-12 NOTE — Telephone Encounter (Signed)
Last OV: 04/28/17 Last filled: 09/11/17 x3 rxs Sig: Take 1 capsule (20 mg total) by mouth every morning UDS: Not on file within the last year

## 2018-01-12 NOTE — Telephone Encounter (Signed)
Rx printed, to PCP for signature. 

## 2018-01-13 DIAGNOSIS — Z136 Encounter for screening for cardiovascular disorders: Secondary | ICD-10-CM | POA: Diagnosis not present

## 2018-01-13 DIAGNOSIS — Z713 Dietary counseling and surveillance: Secondary | ICD-10-CM | POA: Diagnosis not present

## 2018-01-29 DIAGNOSIS — Z1231 Encounter for screening mammogram for malignant neoplasm of breast: Secondary | ICD-10-CM | POA: Diagnosis not present

## 2018-01-29 DIAGNOSIS — Z01419 Encounter for gynecological examination (general) (routine) without abnormal findings: Secondary | ICD-10-CM | POA: Diagnosis not present

## 2018-01-30 DIAGNOSIS — Z124 Encounter for screening for malignant neoplasm of cervix: Secondary | ICD-10-CM | POA: Diagnosis not present

## 2018-02-07 ENCOUNTER — Other Ambulatory Visit: Payer: Self-pay | Admitting: Internal Medicine

## 2018-02-16 DIAGNOSIS — N879 Dysplasia of cervix uteri, unspecified: Secondary | ICD-10-CM | POA: Diagnosis not present

## 2018-02-16 DIAGNOSIS — Z3202 Encounter for pregnancy test, result negative: Secondary | ICD-10-CM | POA: Diagnosis not present

## 2018-02-20 ENCOUNTER — Encounter: Payer: Self-pay | Admitting: Internal Medicine

## 2018-03-09 ENCOUNTER — Ambulatory Visit: Payer: 59 | Admitting: Internal Medicine

## 2018-03-09 ENCOUNTER — Encounter: Payer: Self-pay | Admitting: Internal Medicine

## 2018-03-09 VITALS — BP 110/74 | HR 108 | Temp 98.3°F | Wt 278.0 lb

## 2018-03-09 DIAGNOSIS — Z0000 Encounter for general adult medical examination without abnormal findings: Secondary | ICD-10-CM | POA: Diagnosis not present

## 2018-03-09 MED ORDER — AMPHETAMINE-DEXTROAMPHET ER 20 MG PO CP24: 20.0000 mg | ORAL_CAPSULE | ORAL | 0 refills | Status: DC

## 2018-03-09 MED ORDER — ALPRAZOLAM 0.5 MG PO TABS: 0.5000 mg | ORAL_TABLET | Freq: Three times a day (TID) | ORAL | 0 refills | Status: DC | PRN

## 2018-03-09 NOTE — Patient Instructions (Signed)
You need to lose weight.  Consider a lower calorie diet and regular exercise.    It is important that you exercise regularly, at least 20 minutes 3 to 4 times per week.  If you develop chest pain or shortness of breath seek  medical attention.  Smoking tobacco is very bad for your health. You should stop smoking immediately.  Return in 6 months for follow-up

## 2018-03-09 NOTE — Progress Notes (Signed)
Subjective:    Patient ID: Angela Townsend, female    DOB: [birth year], 42 y.o.   MRN: [id number]  HPI   42 year old patient who is seen today for a preventive health examination. She has a history of dyslipidemia treated with atorvastatin.  She had a recent lipid panel at work earlier this year.  She has been seen by cardiology with stable and infrequent PSVT which is mild and terminated with mild Valsalva. She has history of ADD which has been well controlled on present dose of Adderall.  No new concerns or complaints.  Other issues include ongoing tobacco use.  Family history Father is 83 in good health mother age 99 has hypercholesterolemia lupus and type 2 diabetes One brother and one sister in good health   Past Medical History:  Diagnosis Date  . ADD 01/02/2009  . Headache(784.0) 01/02/2009  . HYPERLIPIDEMIA 01/02/2009  . Obesity   . TOBACCO ABUSE 01/02/2009     Social History   Socioeconomic History  . Marital status: Married    Spouse name: Not on file  . Number of children: Not on file  . Years of education: Not on file  . Highest education level: Not on file  Occupational History  . Not on file  Social Needs  . Financial resource strain: Not on file  . Food insecurity:    Worry: Not on file    Inability: Not on file  . Transportation needs:    Medical: Not on file    Non-medical: Not on file  Tobacco Use  . Smoking status: Current Every Day Smoker    Packs/day: 0.50    Types: Cigarettes  . Smokeless tobacco: Never Used  Substance and Sexual Activity  . Alcohol use: Yes    Comment: occasional  . Drug use: No  . Sexual activity: Not on file  Lifestyle  . Physical activity:    Days per week: Not on file    Minutes per session: Not on file  . Stress: Not on file  Relationships  . Social connections:    Talks on phone: Not on file    Gets together: Not on file    Attends religious service: Not on file    Active member of club or organization: Not  on file    Attends meetings of clubs or organizations: Not on file    Relationship status: Not on file  . Intimate partner violence:    Fear of current or ex partner: Not on file    Emotionally abused: Not on file    Physically abused: Not on file    Forced sexual activity: Not on file  Other Topics Concern  . Not on file  Social History Narrative  . Not on file    Past Surgical History:  Procedure Laterality Date  . WISDOM TOOTH EXTRACTION      Family History  Problem Relation Age of Onset  . Diabetes Mother   . Hyperlipidemia Mother   . Birth defects Maternal Grandmother        breast, colon, brain ca    No Known Allergies  Current Outpatient Medications on File Prior to Visit  Medication Sig Dispense Refill  . ALPRAZolam (XANAX) 0.5 MG tablet Take 1 tablet (0.5 mg total) by mouth 3 (three) times daily as needed for anxiety (panic attacks). 30 tablet 0  . amphetamine-dextroamphetamine (ADDERALL XR) 20 MG 24 hr capsule Take 1 capsule (20 mg total) by mouth every morning. FILL IN TWO MONTHS  30 capsule 0  . amphetamine-dextroamphetamine (ADDERALL XR) 20 MG 24 hr capsule Take 1 capsule (20 mg total) by mouth every morning. 30 capsule 0  . amphetamine-dextroamphetamine (ADDERALL XR) 20 MG 24 hr capsule Take 1 capsule (20 mg total) by mouth every morning. 30 capsule 0  . atorvastatin (LIPITOR) 40 MG tablet TAKE 1 TABLET (40 MG TOTAL) BY MOUTH DAILY. 90 tablet 1  . Melatonin 5 MG TABS Take 5 mg by mouth at bedtime as needed (sleep).    . sertraline (ZOLOFT) 50 MG tablet TAKE 1 TABLET BY MOUTH EVERY DAY 90 tablet 0   No current facility-administered medications on file prior to visit.     BP 110/74 (BP Location: Right Arm, Patient Position: Sitting, Cuff Size: Large)   Pulse (!) 108   Temp 98.3 F (36.8 C) (Oral)   Wt 278 lb (126.1 kg)   LMP 02/09/2018   SpO2 96%   BMI 42.27 kg/m     Review of Systems  Constitutional: Negative.   HENT: Negative for congestion, dental  problem, hearing loss, rhinorrhea, sinus pressure, sore throat and tinnitus.   Eyes: Negative for pain, discharge and visual disturbance.  Respiratory: Negative for cough and shortness of breath.   Cardiovascular: Negative for chest pain, palpitations and leg swelling.  Gastrointestinal: Negative for abdominal distention, abdominal pain, blood in stool, constipation, diarrhea, nausea and vomiting.  Genitourinary: Negative for difficulty urinating, dysuria, flank pain, frequency, hematuria, pelvic pain, urgency, vaginal bleeding, vaginal discharge and vaginal pain.  Musculoskeletal: Negative for arthralgias, gait problem and joint swelling.  Skin: Negative for rash.  Neurological: Negative for dizziness, syncope, speech difficulty, weakness, numbness and headaches.  Hematological: Negative for adenopathy.  Psychiatric/Behavioral: Negative for agitation, behavioral problems and dysphoric mood. The patient is not nervous/anxious.        Objective:   Physical Exam  Constitutional: She is oriented to person, place, and time. She appears well-developed and well-nourished. No distress.  Weight 278 Blood pressure well controlled  HENT:  Head: Normocephalic.  Right Ear: External ear normal.  Left Ear: External ear normal.  Mouth/Throat: Oropharynx is clear and moist.  Pharyngeal crowding  Eyes: Pupils are equal, round, and reactive to light. Conjunctivae and EOM are normal.  Neck: Normal range of motion. Neck supple. No thyromegaly present.  Cardiovascular: Normal rate, regular rhythm, normal heart sounds and intact distal pulses.  Pulmonary/Chest: Effort normal and breath sounds normal.  Abdominal: Soft. Bowel sounds are normal. She exhibits no mass. There is no tenderness.  Musculoskeletal: Normal range of motion.  Lymphadenopathy:    She has no cervical adenopathy.  Neurological: She is alert and oriented to person, place, and time.  Skin: Skin is warm and dry. No rash noted.    Psychiatric: She has a normal mood and affect. Her behavior is normal.          Assessment & Plan:   Preventive health examination Exogenous obesity.  Weight loss encouraged Ongoing tobacco use.  Total smoking cessation encouraged PSVT stable ADHD.  Adderall refilled Dyslipidemia.  Continue statin therapy  Follow-up 6 months  KWIATKOWSKI,PETER Homero FellersFRANK

## 2018-03-10 LAB — CBC WITH DIFFERENTIAL/PLATELET
BASOS ABS: 0.1 10*3/uL (ref 0.0–0.1)
Basophils Relative: 0.8 % (ref 0.0–3.0)
EOS ABS: 0.3 10*3/uL (ref 0.0–0.7)
Eosinophils Relative: 3.6 % (ref 0.0–5.0)
HEMATOCRIT: 41.4 % (ref 36.0–46.0)
Hemoglobin: 13.9 g/dL (ref 12.0–15.0)
LYMPHS ABS: 3 10*3/uL (ref 0.7–4.0)
LYMPHS PCT: 38.6 % (ref 12.0–46.0)
MCHC: 33.6 g/dL (ref 30.0–36.0)
MCV: 87 fl (ref 78.0–100.0)
Monocytes Absolute: 0.4 10*3/uL (ref 0.1–1.0)
Monocytes Relative: 4.9 % (ref 3.0–12.0)
NEUTROS ABS: 4.1 10*3/uL (ref 1.4–7.7)
NEUTROS PCT: 52.1 % (ref 43.0–77.0)
PLATELETS: 235 10*3/uL (ref 150.0–400.0)
RBC: 4.76 Mil/uL (ref 3.87–5.11)
RDW: 14.2 % (ref 11.5–15.5)
WBC: 7.8 10*3/uL (ref 4.0–10.5)

## 2018-03-10 LAB — COMPREHENSIVE METABOLIC PANEL
ALT: 19 U/L (ref 0–35)
AST: 16 U/L (ref 0–37)
Albumin: 4.1 g/dL (ref 3.5–5.2)
Alkaline Phosphatase: 68 U/L (ref 39–117)
BILIRUBIN TOTAL: 0.9 mg/dL (ref 0.2–1.2)
BUN: 10 mg/dL (ref 6–23)
CALCIUM: 9.1 mg/dL (ref 8.4–10.5)
CO2: 28 meq/L (ref 19–32)
CREATININE: 0.91 mg/dL (ref 0.40–1.20)
Chloride: 103 mEq/L (ref 96–112)
GFR: 72.67 mL/min (ref >60.00)
Glucose, Bld: 90 mg/dL (ref 70–99)
Potassium: 3.9 mEq/L (ref 3.5–5.1)
Sodium: 137 mEq/L (ref 135–145)
Total Protein: 6.8 g/dL (ref 6.0–8.3)

## 2018-03-10 LAB — TSH: TSH: 1.57 u[IU]/mL (ref 0.35–4.50)

## 2018-03-25 ENCOUNTER — Encounter: Payer: Self-pay | Admitting: Internal Medicine

## 2018-04-25 ENCOUNTER — Other Ambulatory Visit: Payer: Self-pay | Admitting: Internal Medicine

## 2018-04-29 MED ORDER — ALPRAZOLAM 0.5 MG PO TABS: 0.5000 mg | ORAL_TABLET | Freq: Three times a day (TID) | ORAL | 0 refills | Status: DC | PRN

## 2018-04-29 NOTE — Telephone Encounter (Signed)
Okay to refill? 

## 2018-04-29 NOTE — Telephone Encounter (Signed)
OK for RF 

## 2018-05-03 ENCOUNTER — Other Ambulatory Visit: Payer: Self-pay | Admitting: Internal Medicine

## 2018-06-10 ENCOUNTER — Other Ambulatory Visit: Payer: Self-pay | Admitting: Internal Medicine

## 2018-06-10 MED ORDER — SERTRALINE HCL 50 MG PO TABS: 50.0000 mg | ORAL_TABLET | Freq: Every day | ORAL | 0 refills | Status: DC

## 2018-06-10 NOTE — Telephone Encounter (Signed)
Last OV 03/09/2018   Last refilled 02/09/2018 disp 90 with no refills

## 2018-08-30 NOTE — Progress Notes (Signed)
HPI:  Using dictation device. Unfortunately this device frequently misinterprets words/phrases.  Angela Townsend is a pleasant 42 year old here for an acute visit for a rash on her lower abdomen.  This started last week.  However it has become worse and now it is somewhat painful.  She has a red painful area under her pannus.  Initially was more irritated now painful.  No fevers, malaise, chills, drainage.  Denies any allergies to antibiotics.  ROS: See pertinent positives and negatives per HPI.  Past Medical History:  Diagnosis Date  . ADD 01/02/2009  . Headache(784.0) 01/02/2009  . HYPERLIPIDEMIA 01/02/2009  . Obesity   . TOBACCO ABUSE 01/02/2009    Past Surgical History:  Procedure Laterality Date  . WISDOM TOOTH EXTRACTION      Family History  Problem Relation Age of Onset  . Diabetes Mother   . Hyperlipidemia Mother   . Birth defects Maternal Grandmother        breast, colon, brain ca    SOCIAL HX: See HPI   Current Outpatient Medications:  .  ALPRAZolam (XANAX) 0.5 MG tablet, Take 1 tablet (0.5 mg total) by mouth 3 (three) times daily as needed for anxiety (panic attacks)., Disp: 30 tablet, Rfl: 0 .  amphetamine-dextroamphetamine (ADDERALL XR) 20 MG 24 hr capsule, Take 1 capsule (20 mg total) by mouth every morning. FILL IN TWO MONTHS, Disp: 30 capsule, Rfl: 0 .  amphetamine-dextroamphetamine (ADDERALL XR) 20 MG 24 hr capsule, Take 1 capsule (20 mg total) by mouth every morning., Disp: 30 capsule, Rfl: 0 .  amphetamine-dextroamphetamine (ADDERALL XR) 20 MG 24 hr capsule, Take 1 capsule (20 mg total) by mouth every morning., Disp: 30 capsule, Rfl: 0 .  atorvastatin (LIPITOR) 40 MG tablet, TAKE 1 TABLET (40 MG TOTAL) BY MOUTH DAILY., Disp: 90 tablet, Rfl: 1 .  Melatonin 5 MG TABS, Take 5 mg by mouth at bedtime as needed (sleep)., Disp: , Rfl:  .  sertraline (ZOLOFT) 50 MG tablet, Take 1 tablet (50 mg total) by mouth daily., Disp: 90 tablet, Rfl: 0 .  doxycycline  (VIBRA-TABS) 100 MG tablet, Take 1 tablet (100 mg total) by mouth 2 (two) times daily., Disp: 14 tablet, Rfl: 0 .  fluconazole (DIFLUCAN) 150 MG tablet, Take 1 tablet (150 mg total) by mouth once for 1 dose., Disp: 1 tablet, Rfl: 0  EXAM:  Vitals:   08/31/18 1141  BP: 122/82  Pulse: (!) 103  Temp: 98.1 F (36.7 C)    Body mass index is 43.68 kg/m.  GENERAL: vitals reviewed and listed above, alert, oriented, appears well hydrated and in no acute distress  HEENT: atraumatic, conjunttiva clear, no obvious abnormalities on inspection of external nose and ears  NECK: no obvious masses on inspection  LUNGS: clear to auscultation bilaterally, no wheezes, rales or rhonchi, good air movement  CV: HRRR, no peripheral edema  SKIN: sl macerated skin in erythematous patch in panus crease, slightly larger area approc 10x4 mm in diameter of erythematous indurated skin surrounding, do no appreciate fluctuance on exam today  MS: moves all extremities without noticeable abnormality  PSYCH: pleasant and cooperative, no obvious depression or anxiety  ASSESSMENT AND PLAN:  Discussed the following assessment and plan:  Skin rash  Cellulitis, unspecified cellulitis site  Possible Abscess  -we discussed possible serious and likely etiologies, workup and treatment, treatment risks and return precautions -suspect this may have started out as a yeast rash, no secondary bacterial infection with definite cellulitis, query underlying small abscess though  fluctuance not appreciated on exam today.  Discussed various treatment options including I&D.  She feels she may need some sedation for this type of procedure.  She opted to try treatment with medications first and compresses.  She agrees to seek emergency care if worsening or not improving on treatment. -follow up advised as needed -of course, we advised Hailley  to return or notify a doctor immediately if symptoms worsen or persist or new concerns  arise.  She currently does not have a primary care doctor as her provider retired.  However, she does plan to set up a visit with Dr. Ardyth Harps and agrees to do so as she leaves today.   Patient Instructions  BEFORE YOU LEAVE: -follow up: set up transfer visit with Dr. Ardyth Harps in November  Start the doxycycline today. Do not get in the sun on this medication. Compresses several times daily.   Topical yeast cream - clotrimazole. I also sent diflucan for yeast if needed.  I hope you are feeling better soon! Seek care promptly if your symptoms worsen, new concerns arise or you are not improving with treatment.     Terressa Koyanagi, DO

## 2018-08-31 ENCOUNTER — Encounter: Payer: Self-pay | Admitting: Family Medicine

## 2018-08-31 ENCOUNTER — Ambulatory Visit: Payer: 59 | Admitting: Family Medicine

## 2018-08-31 VITALS — BP 122/82 | HR 103 | Temp 98.1°F | Ht 68.0 in | Wt 287.3 lb

## 2018-08-31 DIAGNOSIS — L0291 Cutaneous abscess, unspecified: Secondary | ICD-10-CM | POA: Diagnosis not present

## 2018-08-31 DIAGNOSIS — L0390 Cellulitis, unspecified: Secondary | ICD-10-CM | POA: Diagnosis not present

## 2018-08-31 DIAGNOSIS — R21 Rash and other nonspecific skin eruption: Secondary | ICD-10-CM | POA: Diagnosis not present

## 2018-08-31 MED ORDER — FLUCONAZOLE 150 MG PO TABS: 150.0000 mg | ORAL_TABLET | Freq: Once | ORAL | 0 refills | Status: AC

## 2018-08-31 MED ORDER — DOXYCYCLINE HYCLATE 100 MG PO TABS: 100.0000 mg | ORAL_TABLET | Freq: Two times a day (BID) | ORAL | 0 refills | Status: DC

## 2018-08-31 NOTE — Patient Instructions (Addendum)
BEFORE YOU LEAVE: -follow up: set up transfer visit with Dr. Ardyth Harps in November  Start the doxycycline today. Do not get in the sun on this medication. Compresses several times daily.   Topical yeast cream - clotrimazole. I also sent diflucan for yeast if needed.  I hope you are feeling better soon! Seek care promptly if your symptoms worsen, new concerns arise or you are not improving with treatment.

## 2018-09-04 ENCOUNTER — Other Ambulatory Visit: Payer: Self-pay

## 2018-09-04 ENCOUNTER — Encounter (HOSPITAL_BASED_OUTPATIENT_CLINIC_OR_DEPARTMENT_OTHER): Payer: Self-pay | Admitting: Emergency Medicine

## 2018-09-04 ENCOUNTER — Emergency Department (HOSPITAL_BASED_OUTPATIENT_CLINIC_OR_DEPARTMENT_OTHER)
Admission: EM | Admit: 2018-09-04 | Discharge: 2018-09-04 | Disposition: A | Discharge status: Home / Self Care | Payer: 59 | Attending: Emergency Medicine | Admitting: Emergency Medicine

## 2018-09-04 DIAGNOSIS — L0291 Cutaneous abscess, unspecified: Secondary | ICD-10-CM

## 2018-09-04 DIAGNOSIS — R55 Syncope and collapse: Secondary | ICD-10-CM | POA: Insufficient documentation

## 2018-09-04 DIAGNOSIS — F17210 Nicotine dependence, cigarettes, uncomplicated: Secondary | ICD-10-CM | POA: Diagnosis not present

## 2018-09-04 DIAGNOSIS — Z79899 Other long term (current) drug therapy: Secondary | ICD-10-CM | POA: Diagnosis not present

## 2018-09-04 DIAGNOSIS — L02211 Cutaneous abscess of abdominal wall: Secondary | ICD-10-CM | POA: Diagnosis not present

## 2018-09-04 DIAGNOSIS — R21 Rash and other nonspecific skin eruption: Secondary | ICD-10-CM | POA: Diagnosis present

## 2018-09-04 DIAGNOSIS — L03311 Cellulitis of abdominal wall: Secondary | ICD-10-CM | POA: Diagnosis not present

## 2018-09-04 LAB — CBC WITH DIFFERENTIAL/PLATELET
ABS IMMATURE GRANULOCYTES: 0.03 10*3/uL (ref 0.00–0.07)
BASOS PCT: 0 %
Basophils Absolute: 0 10*3/uL (ref 0.0–0.1)
Eosinophils Absolute: 0.1 10*3/uL (ref 0.0–0.5)
Eosinophils Relative: 2 %
HCT: 44.7 % (ref 36.0–46.0)
HEMOGLOBIN: 14.3 g/dL (ref 12.0–15.0)
Immature Granulocytes: 0 %
LYMPHS PCT: 20 %
Lymphs Abs: 1.6 10*3/uL (ref 0.7–4.0)
MCH: 28.3 pg (ref 26.0–34.0)
MCHC: 32 g/dL (ref 30.0–36.0)
MCV: 88.5 fL (ref 80.0–100.0)
MONO ABS: 0.4 10*3/uL (ref 0.1–1.0)
Monocytes Relative: 4 %
NEUTROS ABS: 6.2 10*3/uL (ref 1.7–7.7)
Neutrophils Relative %: 74 %
Platelets: 255 10*3/uL (ref 150–400)
RBC: 5.05 MIL/uL (ref 3.87–5.11)
RDW: 13.3 % (ref 11.5–15.5)
WBC: 8.3 10*3/uL (ref 4.0–10.5)
nRBC: 0 % (ref 0.0–0.2)

## 2018-09-04 LAB — HCG, SERUM, QUALITATIVE: Preg, Serum: NEGATIVE

## 2018-09-04 LAB — BASIC METABOLIC PANEL
Anion gap: 9 (ref 5–15)
BUN: 15 mg/dL (ref 6–20)
CHLORIDE: 102 mmol/L (ref 98–111)
CO2: 26 mmol/L (ref 22–32)
CREATININE: 0.87 mg/dL (ref 0.44–1.00)
Calcium: 9.4 mg/dL (ref 8.9–10.3)
GFR calc Af Amer: >60 mL/min (ref >60)
GFR calc non Af Amer: >60 mL/min (ref >60)
GLUCOSE: 114 mg/dL — AB (ref 70–99)
POTASSIUM: 4 mmol/L (ref 3.5–5.1)
SODIUM: 137 mmol/L (ref 135–145)

## 2018-09-04 MED ORDER — LIDOCAINE-EPINEPHRINE (PF) 2 %-1:200000 IJ SOLN
10.0000 mL | Freq: Once | INTRAMUSCULAR | Status: AC
  Administered 2018-09-04: 10 mL via INTRADERMAL
  Filled 2018-09-04 (×2): qty 10

## 2018-09-04 MED ORDER — ONDANSETRON HCL 4 MG/2ML IJ SOLN
INTRAMUSCULAR | Status: AC
  Administered 2018-09-04: 4 mg via INTRAVENOUS
  Filled 2018-09-04: qty 2

## 2018-09-04 MED ORDER — CEPHALEXIN 250 MG PO CAPS
500.0000 mg | ORAL_CAPSULE | Freq: Once | ORAL | Status: AC
  Administered 2018-09-04: 500 mg via ORAL
  Filled 2018-09-04: qty 2

## 2018-09-04 MED ORDER — SULFAMETHOXAZOLE-TRIMETHOPRIM 800-160 MG PO TABS
1.0000 | ORAL_TABLET | Freq: Once | ORAL | Status: AC
  Administered 2018-09-04: 1 via ORAL
  Filled 2018-09-04: qty 1

## 2018-09-04 MED ORDER — SULFAMETHOXAZOLE-TRIMETHOPRIM 800-160 MG PO TABS: 1.0000 | ORAL_TABLET | Freq: Two times a day (BID) | ORAL | 0 refills | Status: AC

## 2018-09-04 MED ORDER — SODIUM CHLORIDE 0.9 % IV BOLUS
1000.0000 mL | Freq: Once | INTRAVENOUS | Status: AC
  Administered 2018-09-04: 1000 mL via INTRAVENOUS

## 2018-09-04 MED ORDER — HYDROCODONE-ACETAMINOPHEN 5-325 MG PO TABS: 1.0000 | ORAL_TABLET | Freq: Four times a day (QID) | ORAL | 0 refills | Status: DC | PRN

## 2018-09-04 MED ORDER — ONDANSETRON HCL 4 MG/2ML IJ SOLN
4.0000 mg | Freq: Once | INTRAMUSCULAR | Status: AC
  Administered 2018-09-04: 4 mg via INTRAVENOUS

## 2018-09-04 MED ORDER — MORPHINE SULFATE (PF) 4 MG/ML IV SOLN
4.0000 mg | Freq: Once | INTRAVENOUS | Status: AC
  Administered 2018-09-04: 4 mg via INTRAVENOUS
  Filled 2018-09-04: qty 1

## 2018-09-04 MED ORDER — CEPHALEXIN 500 MG PO CAPS: 500.0000 mg | ORAL_CAPSULE | Freq: Four times a day (QID) | ORAL | 0 refills | Status: AC

## 2018-09-04 NOTE — ED Provider Notes (Signed)
[facility] EMERGENCY DEPARTMENT Provider Note   CSN: [id number] Arrival date & time: 09/04/18  7829     History   Chief Complaint Chief Complaint  Patient presents with  . Abscess  . Near Syncope    HPI Angela Townsend is a 42 y.o. female.  HPI  42 year old female with past medical history as below here with rash to lower abdomen.  The patient states that for the last week, she has had progressively worsening redness, swelling, and pain along her lower abdomen/upper pubis area.  She was placed on doxycycline several days ago by her primary care doctor.  She states this is improved the redness but she is noticed increasing pain, swelling, and drainage along the inferior aspect.  This began draining spontaneously 24 hours ago.  However, after began draining, she was in the shower began to feel very hot and flushed.  She felt like she is going to pass out.  She had a lower herself to the ground.  She did not actually lose conscious.  Denies any chest pain shortness of breath.  No palpitations.  No history of syncope.  No other complaints.   Past Medical History:  Diagnosis Date  . ADD 01/02/2009  . Headache(784.0) 01/02/2009  . HYPERLIPIDEMIA 01/02/2009  . Obesity   . TOBACCO ABUSE 01/02/2009    Patient Active Problem List   Diagnosis Date Noted  . Paroxysmal supraventricular tachycardia (HCC) 04/17/2016  . Panic attack 03/25/2014  . Anxiety state 01/21/2013  . Obesity 02/07/2012  . Dyslipidemia 01/02/2009  . TOBACCO ABUSE 01/02/2009  . Attention deficit disorder 01/02/2009  . HEADACHE 01/02/2009    Past Surgical History:  Procedure Laterality Date  . WISDOM TOOTH EXTRACTION       OB History   None      Home Medications    Prior to Admission medications   Medication Sig Start Date End Date Taking? Authorizing Provider  ALPRAZolam Prudy Feeler) 0.5 MG tablet Take 1 tablet (0.5 mg total) by mouth 3 (three) times daily as needed for anxiety (panic  attacks). 04/29/18   Gordy Savers, MD  amphetamine-dextroamphetamine (ADDERALL XR) 20 MG 24 hr capsule Take 1 capsule (20 mg total) by mouth every morning. 03/09/18   Gordy Savers, MD  atorvastatin (LIPITOR) 40 MG tablet TAKE 1 TABLET (40 MG TOTAL) BY MOUTH DAILY. 05/05/18   Gordy Savers, MD  cephALEXin (KEFLEX) 500 MG capsule Take 1 capsule (500 mg total) by mouth 4 (four) times daily for 7 days. 09/04/18 09/11/18  Shaune Pollack, MD  doxycycline (VIBRA-TABS) 100 MG tablet Take 1 tablet (100 mg total) by mouth 2 (two) times daily. 08/31/18   Terressa Koyanagi, DO  HYDROcodone-acetaminophen (NORCO/VICODIN) 5-325 MG tablet Take 1-2 tablets by mouth every 6 (six) hours as needed for moderate pain or severe pain. 09/04/18   Shaune Pollack, MD  Melatonin 5 MG TABS Take 5 mg by mouth at bedtime as needed (sleep).    [provider]  sertraline (ZOLOFT) 50 MG tablet Take 1 tablet (50 mg total) by mouth daily. 06/10/18   Gordy Savers, MD  sulfamethoxazole-trimethoprim (BACTRIM DS,SEPTRA DS) 800-160 MG tablet Take 1 tablet by mouth 2 (two) times daily for 7 days. 09/04/18 09/11/18  Shaune Pollack, MD    Family History Family History  Problem Relation Age of Onset  . Diabetes Mother   . Hyperlipidemia Mother   . Birth defects Maternal Grandmother        breast, colon, brain  ca    Social History Social History   Tobacco Use  . Smoking status: Current Every Day Smoker    Packs/day: 0.50    Types: Cigarettes  . Smokeless tobacco: Never Used  Substance Use Topics  . Alcohol use: Yes    Comment: occasional  . Drug use: No     Allergies   Patient has no known allergies.   Review of Systems Review of Systems  Constitutional: Negative for chills, fatigue and fever.  HENT: Negative for congestion and rhinorrhea.   Eyes: Negative for visual disturbance.  Respiratory: Negative for cough, shortness of breath and wheezing.   Cardiovascular: Negative for  chest pain and leg swelling.  Gastrointestinal: Negative for abdominal pain, diarrhea, nausea and vomiting.  Genitourinary: Negative for dysuria and flank pain.  Musculoskeletal: Negative for neck pain and neck stiffness.  Skin: Positive for rash. Negative for wound.  Allergic/Immunologic: Negative for immunocompromised state.  Neurological: Positive for syncope. Negative for weakness and headaches.  All other systems reviewed and are negative.    Physical Exam Updated Vital Signs BP 112/73   Pulse 74   Temp 98.5 F (36.9 C) (Oral)   Resp (!) 26   Ht 5\' 8"  (1.727 m)   Wt 130.2 kg   LMP 08/13/2018   SpO2 99%   BMI 43.64 kg/m   Physical Exam  Constitutional: She is oriented to person, place, and time. She appears well-developed and well-nourished. No distress.  HENT:  Head: Normocephalic and atraumatic.  Eyes: Conjunctivae are normal.  Neck: Neck supple.  Cardiovascular: Normal rate, regular rhythm and normal heart sounds. Exam reveals no friction rub.  No murmur heard. Pulmonary/Chest: Effort normal and breath sounds normal. No respiratory distress. She has no wheezes. She has no rales.  Abdominal: Soft. Bowel sounds are normal. She exhibits no distension. There is tenderness.  Approx 2 x 2 cm area of fluctuance along skin fold of lower abdomen/upper mons, with surrounding skin induration. Spontaneous serosanguinous drainage noted. No crepitance.  Musculoskeletal: She exhibits no edema.  Neurological: She is alert and oriented to person, place, and time. She exhibits normal muscle tone.  Skin: Skin is warm. Capillary refill takes less than 2 seconds.  Psychiatric: She has a normal mood and affect.  Nursing note and vitals reviewed.    ED Treatments / Results  Labs (all labs ordered are listed, but only abnormal results are displayed) Labs Reviewed  BASIC METABOLIC PANEL - Abnormal; Notable for the following components:      Result Value   Glucose, Bld 114 (*)    All  other components within normal limits  CBC WITH DIFFERENTIAL/PLATELET  HCG, SERUM, QUALITATIVE    EKG EKG Interpretation  Date/Time:  Friday September 04 2018 11:04:48 EDT Ventricular Rate:  83 PR Interval:    QRS Duration: 89 QT Interval:  409 QTC Calculation: 481 R Axis:   81 Text Interpretation:  Sinus rhythm No significant change since last tracing Confirmed by Shaune Pollack [phone]) on 09/04/2018 12:03:15 PM   Radiology No results found.  Procedures .Marland KitchenIncision and Drainage Date/Time: 09/04/2018 9:11 PM Performed by: Shaune Pollack, MD Authorized by: Shaune Pollack, MD   Consent:    Consent obtained:  Verbal   Consent given by:  Patient   Risks discussed:  Bleeding, damage to other organs, incomplete drainage, infection and pain   Alternatives discussed:  Alternative treatment and delayed treatment Location:    Type:  Abscess Pre-procedure details:    Skin preparation:  Betadine Anesthesia (  see MAR for exact dosages):    Anesthesia method:  Local infiltration   Local anesthetic:  Lidocaine 1% WITH epi Procedure type:    Complexity:  Simple Procedure details:    Incision types:  Single straight   Incision depth:  Dermal   Scalpel blade:  11   Wound management:  Probed and deloculated and irrigated with saline Post-procedure details:    Patient tolerance of procedure:  Tolerated well, no immediate complications   (including critical care time)   EMERGENCY DEPARTMENT US SOFT TISSUE INTERPRETATION "Study: Limited Soft Tissue Ultrasound"  INDICATIONS: Pain and Soft tissue infection Multiple views of the body part were obtained in real-time with a multi-frequency linear probe  PERFORMED BY: Myself IMAGES ARCHIVED?: Yes SIDE:Midline BODY PART:Abdominal wall INTERPRETATION:  No abcess noted and Cellulitis present     Medications Ordered in ED Medications  sodium chloride 0.9 % bolus 1,000 mL (0 mLs Intravenous Stopped 09/04/18 1230)  morphine 4 MG/ML  injection 4 mg (4 mg Intravenous Given 09/04/18 1044)  lidocaine-EPINEPHrine (XYLOCAINE W/EPI) 2 %-1:200000 (PF) injection 10 mL (10 mLs Intradermal Given by Other 09/04/18 1031)  ondansetron (ZOFRAN) injection 4 mg (4 mg Intravenous Given 09/04/18 1044)  sulfamethoxazole-trimethoprim (BACTRIM DS,SEPTRA DS) 800-160 MG per tablet 1 tablet (1 tablet Oral Given 09/04/18 1235)  cephALEXin (KEFLEX) capsule 500 mg (500 mg Oral Given 09/04/18 1235)     Initial Impression / Assessment and Plan / ED Course  I have reviewed the triage vital signs and the nursing notes.  Pertinent labs & imaging results that were available during my care of the patient were reviewed by me and considered in my medical decision making (see chart for details).     42 yo F here with pain, redness, induration of lower abdominal wall c/w focal abscess with surrounding cellulitis. No fevers, no tachycardia, no hypotension or signs of sepsis or systemic illness. No signs of nec fasc. Lab work obtained 2/2 pt's report of feeling faint in bathroom, and is unremarkable. Lytes wnl. CBC reassuring. HCG neg. Suspect this was vasovagal 2/2 spontaneous drainage from abdominal wall abscess. Following anesthetization, wound opened and explored as above. Will add on keflex/bactrim, start warm soaks, and 48 hour wound check. Pt in agreement. Return precautions given. Repeat U/S after I&D shows no additional abscesses.  Final Clinical Impressions(s) / ED Diagnoses   Final diagnoses:  Abscess  Cellulitis, abdominal wall    ED Discharge Orders         Ordered    cephALEXin (KEFLEX) 500 MG capsule  4 times daily     09/04/18 1318    sulfamethoxazole-trimethoprim (BACTRIM DS,SEPTRA DS) 800-160 MG tablet  2 times daily     09/04/18 1318    HYDROcodone-acetaminophen (NORCO/VICODIN) 5-325 MG tablet  Every 6 hours PRN     09/04/18 1318           Shaune Pollack, MD 09/04/18 2114

## 2018-09-04 NOTE — ED Triage Notes (Signed)
At end of triage, pt states she had a near syncopal event in the shower this am.  She became dizzy and fell in the shower but no LOC.  Pt states she had episode of diarrhea last night.  Denies any dizziness currently.  No N/V/D this am.

## 2018-09-04 NOTE — Discharge Instructions (Addendum)
Apply warm compresses 3-4 x daily until healed  Stop the Doxycycline and start taking the prescribed meds  If redness/swelling continues to spread after 24 hours, return to the ER or seek care at your doctor's

## 2018-09-04 NOTE — ED Triage Notes (Signed)
Abscess to lower abdominal fold.  Pt placed on antibiotics on Monday.  Pt states abscess opened today and started to drain.  Noted area to abdomen is swollen, red, with small opening and sero-sanguinous drainage.

## 2018-09-16 ENCOUNTER — Telehealth: Payer: Self-pay | Admitting: Internal Medicine

## 2018-09-16 NOTE — Telephone Encounter (Signed)
Copied from CRM [id number]. Topic: Quick Communication - Rx Refill/Question >> Sep 16, 2018  3:19 PM Tamela Oddi wrote: Medication: amphetamine-dextroamphetamine (ADDERALL XR) 20 MG 24 hr capsule  Patient called to request a refill for the above medication.  CB# [phone]  Preferred Pharmacy (with phone number or street name): [id number] [place], [place] - [address] [phone] (Phone) [phone] (Fax)

## 2018-09-16 NOTE — Telephone Encounter (Signed)
Requested medication (s) are due for refill today: yes  Requested medication (s) are on the active medication list: yes    Last refill: 03/09/18  #30  0 refills  Future visit scheduled yes  10/13/18  Dr. Loreta Ave  Notes to clinic:Not delegated, Previous RX filled by Dr. Amador Cunas  Requested Prescriptions  Pending Prescriptions Disp Refills   amphetamine-dextroamphetamine (ADDERALL XR) 20 MG 24 hr capsule 30 capsule 0    Sig: Take 1 capsule (20 mg total) by mouth every morning.     Not Delegated - Psychiatry:  Stimulants/ADHD Failed - 09/16/2018  3:22 PM      Failed - This refill cannot be delegated      Failed - Urine Drug Screen completed in last 360 days.      Passed - Valid encounter within last 3 months    Recent Outpatient Visits          2 weeks ago Skin rash   [facility] [employer], [place] R, DO   6 months ago Visit for preventive health examination   [facility], Janett Labella, MD   1 year ago Dyslipidemia   [facility] [employer] [facility], Janett Labella, MD   1 year ago Right upper quadrant abdominal pain   [facility], Eugenio Hoes, MD   1 year ago Attention deficit hyperactivity disorder (ADHD), unspecified ADHD type   [facility] [employer], Janett Labella, MD      Future Appointments            In 3 weeks Angela Townsend, Angela Patricia, MD [facility] [place], [facility]

## 2018-09-17 NOTE — Telephone Encounter (Signed)
Okay for refill? Please advise 

## 2018-09-21 MED ORDER — AMPHETAMINE-DEXTROAMPHET ER 20 MG PO CP24: 20.0000 mg | ORAL_CAPSULE | ORAL | 0 refills | Status: DC

## 2018-09-22 NOTE — Telephone Encounter (Signed)
Pt notes she has been out of meds for 1 week.

## 2018-09-22 NOTE — Telephone Encounter (Signed)
Pt called to f/u on request from 09/17/18 for Adderall. She said that Dr. Selena Batten had asked about refilling meds 10/14 OV but she did not need at that time. She is requesting call back. Ph # [phone]  [id number] [place], [place] - [address]

## 2018-09-28 ENCOUNTER — Other Ambulatory Visit: Payer: Self-pay | Admitting: Family Medicine

## 2018-09-28 MED ORDER — AMPHETAMINE-DEXTROAMPHET ER 20 MG PO CP24: 20.0000 mg | ORAL_CAPSULE | ORAL | 0 refills | Status: DC

## 2018-09-28 NOTE — Telephone Encounter (Signed)
Pt called to check status of refill. Per chart rx on 09/21/18 was sent to print. She has not received phone call to pick up and rx is not at pharmacy.    [facility] [address]   Dr. Tawanna Cooler - Please resend rx to above pharmacy. Thank you!

## 2018-10-13 ENCOUNTER — Ambulatory Visit: Payer: Self-pay | Admitting: Internal Medicine

## 2018-10-14 ENCOUNTER — Encounter: Payer: Self-pay | Admitting: Internal Medicine

## 2018-10-14 ENCOUNTER — Ambulatory Visit: Payer: 59 | Admitting: Internal Medicine

## 2018-10-14 VITALS — BP 120/80 | HR 111 | Temp 97.8°F | Wt 290.8 lb

## 2018-10-14 DIAGNOSIS — I471 Supraventricular tachycardia, unspecified: Secondary | ICD-10-CM

## 2018-10-14 DIAGNOSIS — Z6841 Body Mass Index (BMI) 40.0 and over, adult: Secondary | ICD-10-CM

## 2018-10-14 DIAGNOSIS — E785 Hyperlipidemia, unspecified: Secondary | ICD-10-CM

## 2018-10-14 DIAGNOSIS — F909 Attention-deficit hyperactivity disorder, unspecified type: Secondary | ICD-10-CM | POA: Diagnosis not present

## 2018-10-14 DIAGNOSIS — F17200 Nicotine dependence, unspecified, uncomplicated: Secondary | ICD-10-CM

## 2018-10-14 DIAGNOSIS — F411 Generalized anxiety disorder: Secondary | ICD-10-CM

## 2018-10-14 MED ORDER — ALPRAZOLAM 0.5 MG PO TABS: 0.5000 mg | ORAL_TABLET | Freq: Three times a day (TID) | ORAL | 0 refills | Status: DC | PRN

## 2018-10-14 MED ORDER — ATORVASTATIN CALCIUM 40 MG PO TABS: 40.0000 mg | ORAL_TABLET | Freq: Every day | ORAL | 1 refills | Status: DC

## 2018-10-14 MED ORDER — AMPHETAMINE-DEXTROAMPHET ER 20 MG PO CP24: 20.0000 mg | ORAL_CAPSULE | ORAL | 0 refills | Status: DC

## 2018-10-14 MED ORDER — SERTRALINE HCL 50 MG PO TABS: 50.0000 mg | ORAL_TABLET | Freq: Every day | ORAL | 0 refills | Status: DC

## 2018-10-14 NOTE — Patient Instructions (Signed)
-  Continue efforts in smoking cessation.  -Please make sure you see a psychiatrist/behavioral health counselor.  A brochure has been given to you about options in the community.  -Please schedule follow-up with me in April 2020 for your annual physical.  Please come fasting to that visit.

## 2018-10-14 NOTE — Progress Notes (Signed)
Established Patient Office Visit     CC/Reason for Visit: Establish care, medication refills, follow-up on chronic medical conditions  HPI: Angela Townsend is a 42 y.o. female who is coming in today for the above mentioned reasons.  Due for annual physical exam in April 2020.  Past Medical History is significant for: ADHD that has been treated by her prior PCP, anxiety disorder on Xanax, hyperlipidemia, obesity, ongoing tobacco abuse.  She has no acute complaints on today's visit, needs medication refills.  Saw a psychiatrist in the past has not had regular follow-up.   Past Medical/Surgical History: Past Medical History:  Diagnosis Date  . ADD 01/02/2009  . Headache(784.0) 01/02/2009  . HYPERLIPIDEMIA 01/02/2009  . Obesity   . TOBACCO ABUSE 01/02/2009    Past Surgical History:  Procedure Laterality Date  . WISDOM TOOTH EXTRACTION      Social History:  reports that she has been smoking cigarettes. She has been smoking about 0.50 packs per day. She has never used smokeless tobacco. She reports that she drinks alcohol. She reports that she does not use drugs.  Allergies: No Known Allergies  Family History:  Family History  Problem Relation Age of Onset  . Diabetes Mother   . Hyperlipidemia Mother   . Birth defects Maternal Grandmother        breast, colon, brain ca     Current Outpatient Medications:  .  ALPRAZolam (XANAX) 0.5 MG tablet, Take 1 tablet (0.5 mg total) by mouth 3 (three) times daily as needed for anxiety (panic attacks)., Disp: 30 tablet, Rfl: 0 .  amphetamine-dextroamphetamine (ADDERALL XR) 20 MG 24 hr capsule, Take 1 capsule (20 mg total) by mouth every morning., Disp: 30 capsule, Rfl: 0 .  atorvastatin (LIPITOR) 40 MG tablet, Take 1 tablet (40 mg total) by mouth daily at 6 PM., Disp: 90 tablet, Rfl: 1 .  Melatonin 5 MG TABS, Take 5 mg by mouth at bedtime as needed (sleep)., Disp: , Rfl:  .  sertraline (ZOLOFT) 50 MG tablet, Take 1 tablet (50 mg  total) by mouth daily., Disp: 90 tablet, Rfl: 0  Review of Systems:  Constitutional: Denies fever, chills, diaphoresis, appetite change and fatigue.  HEENT: Denies photophobia, eye pain, redness, hearing loss, ear pain, congestion, sore throat, rhinorrhea, sneezing, mouth sores, trouble swallowing, neck pain, neck stiffness and tinnitus.   Respiratory: Denies SOB, DOE, cough, chest tightness,  and wheezing.   Cardiovascular: Denies chest pain, palpitations and leg swelling.  Gastrointestinal: Denies nausea, vomiting, abdominal pain, diarrhea, constipation, blood in stool and abdominal distention.  Genitourinary: Denies dysuria, urgency, frequency, hematuria, flank pain and difficulty urinating.  Endocrine: Denies: hot or cold intolerance, sweats, changes in hair or nails, polyuria, polydipsia. Musculoskeletal: Denies myalgias, back pain, joint swelling, arthralgias and gait problem.  Skin: Denies pallor, rash and wound.  Neurological: Denies dizziness, seizures, syncope, weakness, light-headedness, numbness and headaches.  Hematological: Denies adenopathy. Easy bruising, personal or family bleeding history  Psychiatric/Behavioral: Denies suicidal ideation, mood changes, confusion, nervousness, sleep disturbance and agitation    Physical Exam: Vitals:   10/14/18 0816  BP: 120/80  Pulse: (!) 111  Temp: 97.8 F (36.6 C)  TempSrc: Oral  SpO2: 97%  Weight: 290 lb 12.8 oz (131.9 kg)    Body mass index is 44.22 kg/m.   Constitutional: NAD, calm, comfortable Eyes: PERRL, lids and conjunctivae normal ENMT: Mucous membranes are moist. Posterior pharynx clear of any exudate or lesions. Normal dentition.  Neck: normal, supple, no  masses, no thyromegaly Respiratory: clear to auscultation bilaterally, no wheezing, no crackles. Normal respiratory effort. No accessory muscle use.  Cardiovascular: Regular rate and rhythm, no murmurs / rubs / gallops. No extremity edema. 2+ pedal pulses. No  carotid bruits.  Musculoskeletal: no clubbing / cyanosis. No joint deformity upper and lower extremities. Good ROM, no contractures. Normal muscle tone.  Skin: no rashes, lesions, ulcers. No induration Neurologic: Grossly intact and nonfocal Psychiatric: Normal judgment and insight. Alert and oriented x 3. Normal mood.    Impression and Plan:  Dyslipidemia - Plan: atorvastatin (LIPITOR) 40 MG tablet -Check lipids at next annual visit  TOBACCO ABUSE -I have discussed tobacco cessation with the patient.  I have counseled the patient regarding the negative impacts of continued tobacco use including but not limited to lung cancer, COPD, and cardiovascular disease.  I have discussed alternatives to tobacco and modalities that may help facilitate tobacco cessation including but not limited to biofeedback, hypnosis, and medications.  Total time spent with tobacco counseling was 4 minutes. -She remains in the pre-contemplative stage.  I have advised her that I am willing to see her every 2 weeks if she ever decides to quit, she has also been informed that we have an excellent counselor on site that could also be of assistance.   Attention deficit hyperactivity disorder (ADHD), unspecified ADHD type - Plan: amphetamine-dextroamphetamine (ADDERALL XR) 20 MG 24 hr capsule -I have informed patient that I am not altogether comfortable to continue to treat her ADHD without input from psychiatry. -Will refill medications for now, no further refills until she can document that she has made this appointment.  Class 3 severe obesity due to excess calories with serious comorbidity and body mass index (BMI) of 40.0 to 44.9 in adult [facility](HCC) -We will need to continue to address at subsequent visits.  Anxiety state - Plan: ALPRAZolam (XANAX) 0.5 MG tablet, sertraline (ZOLOFT) 50 MG tablet -As above, needs to see psychiatry, if they believe that benzodiazepines are necessary for treatment of her anxiety they will  need to assume prescribing. -She will receive prescriptions for now until she can see psychiatry. -Continue Zoloft.  Paroxysmal supraventricular tachycardia (HCC) -Occasional, no issues with this recently.     Patient Instructions  -Continue efforts in smoking cessation.  -Please make sure you see a psychiatrist/behavioral health counselor.  A brochure has been given to you about options in the community.  -Please schedule follow-up with me in April 2020 for your annual physical.  Please come fasting to that visit.     Chaya JanEstela Hernandez Acosta, MD [facility]

## 2018-12-17 ENCOUNTER — Other Ambulatory Visit: Payer: Self-pay | Admitting: Internal Medicine

## 2018-12-17 DIAGNOSIS — F909 Attention-deficit hyperactivity disorder, unspecified type: Secondary | ICD-10-CM

## 2018-12-17 NOTE — Telephone Encounter (Signed)
10/14/18 office note:  Attention deficit hyperactivity disorder (ADHD), unspecified ADHD type - Plan: amphetamine-dextroamphetamine (ADDERALL XR) 20 MG 24 hr capsule -I have informed patient that I am not altogether comfortable to continue to treat her ADHD without input from psychiatry. -Will refill medications for now, no further refills until she can document that she has made this appointment.  Okay to fill?

## 2018-12-17 NOTE — Telephone Encounter (Signed)
Has she made appt with psych or behavioral health? If yes, ok to refill for 3 months. If no, give her 1 month until she can make appointment. NO FURTHER refills until appointment has been made.

## 2018-12-17 NOTE — Telephone Encounter (Signed)
Copied from CRM [id number]. Topic: Quick Communication - Rx Refill/Question >> Dec 17, 2018  8:23 AM Fanny Bien wrote: Medication:amphetamine-dextroamphetamine (ADDERALL XR) 20 MG 24 hr capsule [[id number]]   Has the patient contacted their pharmacy? no Preferred Pharmacy (with phone number [facility] name):[phone] [place], [place] - [address] [phone] (Phone) [phone] (Fax)  Agent: Please be advised that RX refills may take up to 3 business days. We ask that you follow-up with your pharmacy.

## 2018-12-18 MED ORDER — AMPHETAMINE-DEXTROAMPHET ER 20 MG PO CP24: 20.0000 mg | ORAL_CAPSULE | ORAL | 0 refills | Status: DC

## 2018-12-18 NOTE — Telephone Encounter (Signed)
Spoke with patient and she has not yet made the appointment for ADHD.  She states that she has done some research and plans on making an appointment before her next appointment with Dr Ardyth Harps.

## 2019-01-04 DIAGNOSIS — Z713 Dietary counseling and surveillance: Secondary | ICD-10-CM | POA: Diagnosis not present

## 2019-01-04 DIAGNOSIS — Z136 Encounter for screening for cardiovascular disorders: Secondary | ICD-10-CM | POA: Diagnosis not present

## 2019-01-04 DIAGNOSIS — Z13220 Encounter for screening for lipoid disorders: Secondary | ICD-10-CM | POA: Diagnosis not present

## 2019-01-05 ENCOUNTER — Ambulatory Visit: Payer: 59 | Admitting: Internal Medicine

## 2019-01-05 ENCOUNTER — Encounter: Payer: Self-pay | Admitting: Internal Medicine

## 2019-01-05 VITALS — BP 130/80 | HR 111 | Temp 98.0°F | Wt 295.0 lb

## 2019-01-05 DIAGNOSIS — J029 Acute pharyngitis, unspecified: Secondary | ICD-10-CM | POA: Diagnosis not present

## 2019-01-05 DIAGNOSIS — H6690 Otitis media, unspecified, unspecified ear: Secondary | ICD-10-CM

## 2019-01-05 DIAGNOSIS — F17200 Nicotine dependence, unspecified, uncomplicated: Secondary | ICD-10-CM | POA: Diagnosis not present

## 2019-01-05 DIAGNOSIS — Z716 Tobacco abuse counseling: Secondary | ICD-10-CM

## 2019-01-05 MED ORDER — AMOXICILLIN-POT CLAVULANATE 875-125 MG PO TABS: 1.0000 | ORAL_TABLET | Freq: Two times a day (BID) | ORAL | 0 refills | Status: AC

## 2019-01-05 NOTE — Patient Instructions (Addendum)
Great seeing you today.  Instructions: -swish with warm salt water to decrease throat pain -take tylenol or aleve for ear pain as needed -take augmentin twice a day for 7 days for bilateral ear infections, pls finish entire medication and take with food on your stomach  Smoking Tobacco Information, Adult Smoking tobacco can be harmful to your health. Tobacco contains a poisonous (toxic), colorless chemical called nicotine. Nicotine is addictive. It changes the brain and can make it hard to stop smoking. Tobacco also has other toxic chemicals that can hurt your body and raise your risk of many cancers. How can smoking tobacco affect me? Smoking tobacco puts you at risk for:  Cancer. Smoking is most commonly associated with lung cancer, but can also lead to cancer in other parts of the body.  Chronic obstructive pulmonary disease (COPD). This is a long-term lung condition that makes it hard to breathe. It also gets worse over time.  High blood pressure (hypertension), heart disease, stroke, or heart attack.  Lung infections, such as pneumonia.  Cataracts. This is when the lenses in the eyes become clouded.  Digestive problems. This may include peptic ulcers, heartburn, and gastroesophageal reflux disease (GERD).  Oral health problems, such as gum disease and tooth loss.  Loss of taste and smell. Smoking can affect your appearance by causing:  Wrinkles.  Yellow or stained teeth, fingers, and fingernails. Smoking tobacco can also affect your social life, because:  It may be challenging to find places to smoke when away from home. Many workplaces, [employer], hotels, and public places are tobacco-free.  Smoking is expensive. This is due to the cost of tobacco and the long-term costs of treating health problems from smoking.  Secondhand smoke may affect those around you. Secondhand smoke can cause lung cancer, breathing problems, and heart disease. Children of smokers have a higher  risk for: ? Sudden infant death syndrome (SIDS). ? Ear infections. ? Lung infections. If you currently smoke tobacco, quitting now can help you:  Lead a longer and healthier life.  Look, smell, breathe, and feel better over time.  Save money.  Protect others from the harms of secondhand smoke. What actions can I take to prevent health problems? Quit smoking   Do not start smoking. Quit if you already do.  Make a plan to quit smoking and commit to it. Look for programs to help you and ask your health care provider for recommendations and ideas.  Set a date and write down all the reasons you want to quit.  Let your friends and family know you are quitting so they can help and support you. Consider finding friends who also want to quit. It can be easier to quit with someone else, so that you can support each other.  Talk with your health care provider about using nicotine replacement medicines to help you quit, such as gum, lozenges, patches, sprays, or pills.  Do not replace cigarette smoking with electronic cigarettes, which are commonly called e-cigarettes. The safety of e-cigarettes is not known, and some may contain harmful chemicals.  If you try to quit but return to smoking, stay positive. It is common to slip up when you first quit, so take it one day at a time.  Be prepared for cravings. When you feel the urge to smoke, chew gum or suck on hard candy. Lifestyle  Stay busy and take care of your body.  Drink enough fluid to keep your urine pale yellow.  Get plenty of exercise and  eat a healthy diet. This can help prevent weight gain after quitting.  Monitor your eating habits. Quitting smoking can cause you to have a larger appetite than when you smoke.  Find ways to relax. Go out with friends or family to a movie or a restaurant where people do not smoke.  Ask your health care provider about having regular tests (screenings) to check for cancer. This may include blood  tests, imaging tests, and other tests.  Find ways to manage your stress, such as meditation, yoga, or exercise. Where to find support To get support to quit smoking, consider:  Asking your health care provider for more information and resources.  Taking classes to learn more about quitting smoking.  Looking for local organizations that offer resources about quitting smoking.  Joining a support group for people who want to quit smoking in your local community.  Calling the smokefree.gov counselor helpline: 1-800-Quit-Now 4167306211) Where to find more information You may find more information about quitting smoking from:  HelpGuide.org: www.helpguide.org  BankRights.uy: smokefree.gov  American Lung Association: www.lung.org Contact a health care provider if you:  Have problems breathing.  Notice that your lips, nose, or fingers turn blue.  Have chest pain.  Are coughing up blood.  Feel faint or you pass out.  Have other health changes that cause you to worry. Summary  Smoking tobacco can negatively affect your health, the health of those around you, your finances, and your social life.  Do not start smoking. Quit if you already do. If you need help quitting, ask your health care provider.  Think about joining a support group for people who want to quit smoking in your local community. There are many effective programs that will help you to quit this behavior. This information is not intended to replace advice given to you by your health care provider. Make sure you discuss any questions you have with your health care provider. Document Released: 11/19/2016 Document Revised: 12/24/2017 Document Reviewed: 11/19/2016 Elsevier Interactive Patient Education  2019 [employer].

## 2019-01-05 NOTE — Progress Notes (Signed)
Established Patient Office Visit     CC/Reason for Visit: sore throat & bilateral ear pain  HPI: Angela Townsend is a 43 y.o. female who is coming in today for the above mentioned reasons. Past Medical History is significant for seasonal allergies and tobacco abuse.  Patient c/o sore throat x2 weeks and bilateral ear pain that started about 1.5 weeks ago.  Patient sts she has had a runny nose, but is able to eat and drink ok.  Patient c/o coughing up phelm and sts its clear and moderate in thickness.  Denies fever, N/V/D.  Denies fullness in the ears.  Ear pain feels like shooting pain that comes and goes.  Patient smokes 5 cigarettes daily. Smoking cessation instructions were reviewed, but patient sts she is not ready to quit yet and will let us know when she is ready.    Past Medical/Surgical History: Past Medical History:  Diagnosis Date  . ADD 01/02/2009  . Headache(784.0) 01/02/2009  . HYPERLIPIDEMIA 01/02/2009  . Obesity   . TOBACCO ABUSE 01/02/2009    Past Surgical History:  Procedure Laterality Date  . WISDOM TOOTH EXTRACTION      Social History:  reports that she has been smoking cigarettes. She has been smoking about 0.50 packs per day. She has never used smokeless tobacco. She reports current alcohol use. She reports that she does not use drugs.  Allergies: No Known Allergies  Family History:  Family History  Problem Relation Age of Onset  . Diabetes Mother   . Hyperlipidemia Mother   . Birth defects Maternal Grandmother        breast, colon, brain ca     Current Outpatient Medications:  .  ALPRAZolam (XANAX) 0.5 MG tablet, Take 1 tablet (0.5 mg total) by mouth 3 (three) times daily as needed for anxiety (panic attacks)., Disp: 30 tablet, Rfl: 0 .  amphetamine-dextroamphetamine (ADDERALL XR) 20 MG 24 hr capsule, Take 1 capsule (20 mg total) by mouth every morning., Disp: 30 capsule, Rfl: 0 .  atorvastatin (LIPITOR) 40 MG tablet, Take 1 tablet (40 mg  total) by mouth daily at 6 PM., Disp: 90 tablet, Rfl: 1 .  Melatonin 5 MG TABS, Take 5 mg by mouth at bedtime as needed (sleep)., Disp: , Rfl:  .  sertraline (ZOLOFT) 50 MG tablet, Take 1 tablet (50 mg total) by mouth daily., Disp: 90 tablet, Rfl: 0 .  amoxicillin-clavulanate (AUGMENTIN) 875-125 MG tablet, Take 1 tablet by mouth 2 (two) times daily for 7 days., Disp: 14 tablet, Rfl: 0  Review of Systems:  Constitutional: Denies fever, chills, diaphoresis, appetite change and fatigue.  HEENT: Denies photophobia, eye pain, redness, hearing loss, sneezing, mouth sores, trouble swallowing, neck pain, neck stiffness and tinnitus.  C/o sore throat, bilateral ear pain and a runny nose Respiratory: Denies SOB, DOE, cough, chest tightness, and wheezing.   Cardiovascular: Denies chest pain, palpitations and leg swelling.  Gastrointestinal: Denies nausea, vomiting, abdominal pain, diarrhea, constipation, blood in stool and abdominal distention.  Genitourinary: Denies dysuria, urgency, frequency, hematuria, flank pain and difficulty urinating.  Endocrine: Denies: hot or cold intolerance, sweats, changes in hair or nails, polyuria, polydipsia. Musculoskeletal: Denies myalgias, back pain, joint swelling, arthralgias and gait problem.  Skin: Denies pallor, rash and wound.  Neurological: Denies dizziness, seizures, syncope, weakness, light-headedness, numbness and headaches.  Hematological: Denies adenopathy. Easy bruising, personal or family bleeding history  Psychiatric/Behavioral: Denies suicidal ideation, mood changes, confusion, nervousness, sleep disturbance and agitation   Physical  Exam: Vitals:   01/05/19 1606  BP: 130/80  Pulse: (!) 111  Temp: 98 F (36.7 C)  TempSrc: Oral  SpO2: 97%  Weight: 295 lb (133.8 kg)    Body mass index is 44.85 kg/m.   Constitutional: NAD, calm, comfortable Eyes: PERRL, lids and conjunctivae normal ENMT: Mucous membranes are moist. Posterior pharynx clear of  any exudate or lesions. Normal dentition.  Right ear looks the worst; redness to ear canal and bulging of tempanic membrane, left ear some bulging of tempanic membrance  Respiratory: clear to auscultation bilaterally, no wheezing, no crackles. Normal respiratory effort. No accessory muscle use.  Cardiovascular: Regular rate and rhythm, no murmurs / rubs / gallops. No extremity edema.  Psychiatric: Normal judgment and insight. Alert and oriented x 3. Normal mood.    Impression and Plan:  Acute pharyngitis, unspecified etiology -swish with warm salt water as needed for discomfort  TOBACCO ABUSE -1/2 ppd -smoking cessation discussed  Acute otitis media, unspecified otitis media type -will rxn augmentin for 7 days -take tylenol or aleve OTC as needed for discomfort  Encounter for tobacco use cessation counseling -will give handout to patient     Patient Instructions  Great seeing you today.  Instructions: -swish with warm salt water to decrease throat pain -take tylenol or aleve for ear pain as needed -take augmentin twice a day for 7 days for bilateral ear infections, pls finish entire medication and take with food on your stomach  Smoking Tobacco Information, Adult Smoking tobacco can be harmful to your health. Tobacco contains a poisonous (toxic), colorless chemical called nicotine. Nicotine is addictive. It changes the brain and can make it hard to stop smoking. Tobacco also has other toxic chemicals that can hurt your body and raise your risk of many cancers. How can smoking tobacco affect me? Smoking tobacco puts you at risk for:  Cancer. Smoking is most commonly associated with lung cancer, but can also lead to cancer in other parts of the body.  Chronic obstructive pulmonary disease (COPD). This is a long-term lung condition that makes it hard to breathe. It also gets worse over time.  High blood pressure (hypertension), heart disease, stroke, or heart attack.  Lung  infections, such as pneumonia.  Cataracts. This is when the lenses in the eyes become clouded.  Digestive problems. This may include peptic ulcers, heartburn, and gastroesophageal reflux disease (GERD).  Oral health problems, such as gum disease and tooth loss.  Loss of taste and smell. Smoking can affect your appearance by causing:  Wrinkles.  Yellow or stained teeth, fingers, and fingernails. Smoking tobacco can also affect your social life, because:  It may be challenging to find places to smoke when away from home. Many workplaces, [employer], hotels, and public places are tobacco-free.  Smoking is expensive. This is due to the cost of tobacco and the long-term costs of treating health problems from smoking.  Secondhand smoke may affect those around you. Secondhand smoke can cause lung cancer, breathing problems, and heart disease. Children of smokers have a higher risk for: ? Sudden infant death syndrome (SIDS). ? Ear infections. ? Lung infections. If you currently smoke tobacco, quitting now can help you:  Lead a longer and healthier life.  Look, smell, breathe, and feel better over time.  Save money.  Protect others from the harms of secondhand smoke. What actions can I take to prevent health problems? Quit smoking   Do not start smoking. Quit if you already do.  Make a  plan to quit smoking and commit to it. Look for programs to help you and ask your health care provider for recommendations and ideas.  Set a date and write down all the reasons you want to quit.  Let your friends and family know you are quitting so they can help and support you. Consider finding friends who also want to quit. It can be easier to quit with someone else, so that you can support each other.  Talk with your health care provider about using nicotine replacement medicines to help you quit, such as gum, lozenges, patches, sprays, or pills.  Do not replace cigarette smoking with  electronic cigarettes, which are commonly called e-cigarettes. The safety of e-cigarettes is not known, and some may contain harmful chemicals.  If you try to quit but return to smoking, stay positive. It is common to slip up when you first quit, so take it one day at a time.  Be prepared for cravings. When you feel the urge to smoke, chew gum or suck on hard candy. Lifestyle  Stay busy and take care of your body.  Drink enough fluid to keep your urine pale yellow.  Get plenty of exercise and eat a healthy diet. This can help prevent weight gain after quitting.  Monitor your eating habits. Quitting smoking can cause you to have a larger appetite than when you smoke.  Find ways to relax. Go out with friends or family to a movie or a restaurant where people do not smoke.  Ask your health care provider about having regular tests (screenings) to check for cancer. This may include blood tests, imaging tests, and other tests.  Find ways to manage your stress, such as meditation, yoga, or exercise. Where to find support To get support to quit smoking, consider:  Asking your health care provider for more information and resources.  Taking classes to learn more about quitting smoking.  Looking for local organizations that offer resources about quitting smoking.  Joining a support group for people who want to quit smoking in your local community.  Calling the smokefree.gov counselor helpline: 1-800-Quit-Now 501-142-9016) Where to find more information You may find more information about quitting smoking from:  HelpGuide.org: www.helpguide.org  BankRights.[employer]: smokefree.gov  American Lung Association: www.lung.org Contact a health care provider if you:  Have problems breathing.  Notice that your lips, nose, or fingers turn blue.  Have chest pain.  Are coughing up blood.  Feel faint or you pass out.  Have other health changes that cause you to worry. Summary  Smoking  tobacco can negatively affect your health, the health of those around you, your finances, and your social life.  Do not start smoking. Quit if you already do. If you need help quitting, ask your health care provider.  Think about joining a support group for people who want to quit smoking in your local community. There are many effective programs that will help you to quit this behavior. This information is not intended to replace advice given to you by your health care provider. Make sure you discuss any questions you have with your health care provider. Document Released: 11/19/2016 Document Revised: 12/24/2017 Document Reviewed: 11/19/2016 Elsevier Interactive Patient Education  2019 Elsevier Inc.      Murlean Iba, RN DNP Student  Primary [facility] at [facility]

## 2019-02-03 ENCOUNTER — Encounter: Payer: Self-pay | Admitting: Internal Medicine

## 2019-02-03 ENCOUNTER — Encounter: Payer: Self-pay | Admitting: *Deleted

## 2019-02-03 ENCOUNTER — Other Ambulatory Visit: Payer: Self-pay | Admitting: Internal Medicine

## 2019-02-03 DIAGNOSIS — F909 Attention-deficit hyperactivity disorder, unspecified type: Secondary | ICD-10-CM

## 2019-02-03 DIAGNOSIS — F411 Generalized anxiety disorder: Secondary | ICD-10-CM

## 2019-02-03 MED ORDER — AMPHETAMINE-DEXTROAMPHET ER 20 MG PO CP24: 20.0000 mg | ORAL_CAPSULE | ORAL | 0 refills | Status: DC

## 2019-02-24 ENCOUNTER — Ambulatory Visit (INDEPENDENT_AMBULATORY_CARE_PROVIDER_SITE_OTHER): Payer: 59 | Admitting: Psychology

## 2019-02-24 DIAGNOSIS — F900 Attention-deficit hyperactivity disorder, predominantly inattentive type: Secondary | ICD-10-CM | POA: Diagnosis not present

## 2019-02-24 DIAGNOSIS — F411 Generalized anxiety disorder: Secondary | ICD-10-CM | POA: Diagnosis not present

## 2019-03-04 ENCOUNTER — Other Ambulatory Visit: Payer: Self-pay | Admitting: Internal Medicine

## 2019-03-04 DIAGNOSIS — F411 Generalized anxiety disorder: Secondary | ICD-10-CM

## 2019-03-04 NOTE — Telephone Encounter (Signed)
Patient had an appointment with Mr Jennelle Human.  They discussed Adderall, but patient states she didn't know that she should mention Xanax.

## 2019-03-05 MED ORDER — ALPRAZOLAM 0.5 MG PO TABS: 0.5000 mg | ORAL_TABLET | Freq: Three times a day (TID) | ORAL | 0 refills | Status: DC | PRN

## 2019-03-11 ENCOUNTER — Other Ambulatory Visit: Payer: Self-pay | Admitting: Internal Medicine

## 2019-03-11 DIAGNOSIS — F909 Attention-deficit hyperactivity disorder, unspecified type: Secondary | ICD-10-CM

## 2019-03-12 ENCOUNTER — Ambulatory Visit (INDEPENDENT_AMBULATORY_CARE_PROVIDER_SITE_OTHER): Payer: 59 | Admitting: Psychology

## 2019-03-12 DIAGNOSIS — F900 Attention-deficit hyperactivity disorder, predominantly inattentive type: Secondary | ICD-10-CM

## 2019-03-12 DIAGNOSIS — F411 Generalized anxiety disorder: Secondary | ICD-10-CM

## 2019-03-12 MED ORDER — AMPHETAMINE-DEXTROAMPHET ER 20 MG PO CP24: 20.0000 mg | ORAL_CAPSULE | ORAL | 0 refills | Status: DC

## 2019-03-23 ENCOUNTER — Ambulatory Visit: Payer: 59 | Admitting: Psychology

## 2019-04-06 ENCOUNTER — Ambulatory Visit (INDEPENDENT_AMBULATORY_CARE_PROVIDER_SITE_OTHER): Payer: 59 | Admitting: Psychology

## 2019-04-06 DIAGNOSIS — F900 Attention-deficit hyperactivity disorder, predominantly inattentive type: Secondary | ICD-10-CM

## 2019-04-06 DIAGNOSIS — F411 Generalized anxiety disorder: Secondary | ICD-10-CM

## 2019-04-19 ENCOUNTER — Other Ambulatory Visit: Payer: Self-pay | Admitting: Internal Medicine

## 2019-04-19 DIAGNOSIS — F909 Attention-deficit hyperactivity disorder, unspecified type: Secondary | ICD-10-CM

## 2019-04-20 NOTE — Telephone Encounter (Signed)
Virtual visit scheduled.  

## 2019-04-20 NOTE — Telephone Encounter (Signed)
Virtual visit 

## 2019-04-22 ENCOUNTER — Ambulatory Visit (INDEPENDENT_AMBULATORY_CARE_PROVIDER_SITE_OTHER): Payer: 59 | Admitting: Internal Medicine

## 2019-04-22 ENCOUNTER — Encounter: Payer: Self-pay | Admitting: Internal Medicine

## 2019-04-22 ENCOUNTER — Ambulatory Visit (INDEPENDENT_AMBULATORY_CARE_PROVIDER_SITE_OTHER): Payer: 59 | Admitting: Psychology

## 2019-04-22 ENCOUNTER — Other Ambulatory Visit: Payer: Self-pay

## 2019-04-22 DIAGNOSIS — F17210 Nicotine dependence, cigarettes, uncomplicated: Secondary | ICD-10-CM | POA: Diagnosis not present

## 2019-04-22 DIAGNOSIS — F900 Attention-deficit hyperactivity disorder, predominantly inattentive type: Secondary | ICD-10-CM | POA: Diagnosis not present

## 2019-04-22 DIAGNOSIS — F909 Attention-deficit hyperactivity disorder, unspecified type: Secondary | ICD-10-CM | POA: Diagnosis not present

## 2019-04-22 DIAGNOSIS — E785 Hyperlipidemia, unspecified: Secondary | ICD-10-CM

## 2019-04-22 DIAGNOSIS — F411 Generalized anxiety disorder: Secondary | ICD-10-CM | POA: Diagnosis not present

## 2019-04-22 DIAGNOSIS — F17200 Nicotine dependence, unspecified, uncomplicated: Secondary | ICD-10-CM

## 2019-04-22 MED ORDER — AMPHETAMINE-DEXTROAMPHET ER 20 MG PO CP24: 20.0000 mg | ORAL_CAPSULE | ORAL | 0 refills | Status: DC

## 2019-04-22 MED ORDER — ATORVASTATIN CALCIUM 40 MG PO TABS: 40.0000 mg | ORAL_TABLET | Freq: Every day | ORAL | 1 refills | Status: DC

## 2019-04-22 NOTE — Progress Notes (Signed)
Virtual Visit via Video Note  I connected with Angela Townsend on 04/22/19 at  8:30 AM EDT by a video enabled telemedicine application and verified that I am speaking with the correct person using two identifiers.  Location patient: home Location provider: work office Persons participating in the virtual visit: patient, provider  I discussed the limitations of evaluation and management by telemedicine and the availability of in person appointments. The patient expressed understanding and agreed to proceed.   HPI: This is a scheduled visit to discuss medication refills.  She has a history of ADHD and anxiety.  At my request has been seeing Mr. Maggie Font for CBT, she has her third session today.  She is due for her 10-month refills of Adderall per protocol.  She has no acute complaints.  Mentions she also needs refills of her Lipitor.  She continues to smoke about 5 cigarettes a day.   ROS: Constitutional: Denies fever, chills, diaphoresis, appetite change and fatigue.  HEENT: Denies photophobia, eye pain, redness, hearing loss, ear pain, congestion, sore throat, rhinorrhea, sneezing, mouth sores, trouble swallowing, neck pain, neck stiffness and tinnitus.   Respiratory: Denies SOB, DOE, cough, chest tightness,  and wheezing.   Cardiovascular: Denies chest pain, palpitations and leg swelling.  Gastrointestinal: Denies nausea, vomiting, abdominal pain, diarrhea, constipation, blood in stool and abdominal distention.  Genitourinary: Denies dysuria, urgency, frequency, hematuria, flank pain and difficulty urinating.  Endocrine: Denies: hot or cold intolerance, sweats, changes in hair or nails, polyuria, polydipsia. Musculoskeletal: Denies myalgias, back pain, joint swelling, arthralgias and gait problem.  Skin: Denies pallor, rash and wound.  Neurological: Denies dizziness, seizures, syncope, weakness, light-headedness, numbness and headaches.  Hematological: Denies adenopathy. Easy  bruising, personal or family bleeding history  Psychiatric/Behavioral: Denies suicidal ideation, mood changes, confusion, nervousness, sleep disturbance and agitation   Past Medical History:  Diagnosis Date  . ADD 01/02/2009  . Headache(784.0) 01/02/2009  . HYPERLIPIDEMIA 01/02/2009  . Obesity   . TOBACCO ABUSE 01/02/2009    Past Surgical History:  Procedure Laterality Date  . WISDOM TOOTH EXTRACTION      Family History  Problem Relation Age of Onset  . Diabetes Mother   . Hyperlipidemia Mother   . Birth defects Maternal Grandmother        breast, colon, brain ca    SOCIAL HX:   reports that she has been smoking cigarettes. She has been smoking about 0.50 packs per day. She has never used smokeless tobacco. She reports current alcohol use. She reports that she does not use drugs.   Current Outpatient Medications:  .  ALPRAZolam (XANAX) 0.5 MG tablet, Take 1 tablet (0.5 mg total) by mouth 3 (three) times daily as needed for anxiety (panic attacks)., Disp: 30 tablet, Rfl: 0 .  amphetamine-dextroamphetamine (ADDERALL XR) 20 MG 24 hr capsule, Take 1 capsule (20 mg total) by mouth every morning., Disp: 30 capsule, Rfl: 0 .  amphetamine-dextroamphetamine (ADDERALL XR) 20 MG 24 hr capsule, Take 1 capsule (20 mg total) by mouth every morning., Disp: 30 capsule, Rfl: 0 .  amphetamine-dextroamphetamine (ADDERALL XR) 20 MG 24 hr capsule, Take 1 capsule (20 mg total) by mouth every morning., Disp: 30 capsule, Rfl: 0 .  atorvastatin (LIPITOR) 40 MG tablet, Take 1 tablet (40 mg total) by mouth daily at 6 PM., Disp: 90 tablet, Rfl: 1 .  Melatonin 5 MG TABS, Take 5 mg by mouth at bedtime as needed (sleep)., Disp: , Rfl:  .  sertraline (ZOLOFT) 50  MG tablet, Take 1 tablet (50 mg total) by mouth daily., Disp: 90 tablet, Rfl: 0  EXAM:   VITALS per patient if applicable: None reported  GENERAL: alert, oriented, appears well and in no acute distress  HEENT: atraumatic, conjunttiva clear, no  obvious abnormalities on inspection of external nose and ears  NECK: normal movements of the head and neck  LUNGS: on inspection no signs of respiratory distress, breathing rate appears normal, no obvious gross increased work of breathing, gasping or wheezing  CV: no obvious cyanosis  MS: moves all visible extremities without noticeable abnormality  PSYCH/NEURO: pleasant and cooperative, no obvious depression or anxiety, speech and thought processing grossly intact  ASSESSMENT AND PLAN:   Attention deficit hyperactivity disorder (ADHD), unspecified ADHD type  -Refilled Adderall for 3 months as per protocol.  Dyslipidemia  -Refilled Lipitor. -Check lipids at next visit in 3 months.  TOBACCO ABUSE -I have discussed tobacco cessation with the patient.  I have counseled the patient regarding the negative impacts of continued tobacco use including but not limited to lung cancer, COPD, and cardiovascular disease.  I have discussed alternatives to tobacco and modalities that may help facilitate tobacco cessation including but not limited to biofeedback, hypnosis, and medications.  Total time spent with tobacco counseling was 4 minutes. -She remains in the pre-contemplative stage, "I am not quite ready to quit yet". -Will continue to address at subsequent visits.    I discussed the assessment and treatment plan with the patient. The patient was provided an opportunity to ask questions and all were answered. The patient agreed with the plan and demonstrated an understanding of the instructions.   The patient was advised to call back or seek an in-person evaluation if the symptoms worsen or if the condition fails to improve as anticipated.    Chaya JanEstela Hernandez Acosta, MD  [facility]

## 2019-05-06 ENCOUNTER — Ambulatory Visit (INDEPENDENT_AMBULATORY_CARE_PROVIDER_SITE_OTHER): Payer: 59 | Admitting: Psychology

## 2019-05-06 DIAGNOSIS — F900 Attention-deficit hyperactivity disorder, predominantly inattentive type: Secondary | ICD-10-CM

## 2019-05-06 DIAGNOSIS — F411 Generalized anxiety disorder: Secondary | ICD-10-CM

## 2019-06-03 ENCOUNTER — Ambulatory Visit: Payer: 59 | Admitting: Psychology

## 2019-06-11 ENCOUNTER — Ambulatory Visit (INDEPENDENT_AMBULATORY_CARE_PROVIDER_SITE_OTHER): Payer: 59 | Admitting: Psychology

## 2019-06-11 DIAGNOSIS — F901 Attention-deficit hyperactivity disorder, predominantly hyperactive type: Secondary | ICD-10-CM

## 2019-06-11 DIAGNOSIS — F411 Generalized anxiety disorder: Secondary | ICD-10-CM

## 2019-06-22 ENCOUNTER — Other Ambulatory Visit: Payer: Self-pay | Admitting: Internal Medicine

## 2019-06-22 DIAGNOSIS — F411 Generalized anxiety disorder: Secondary | ICD-10-CM

## 2019-06-22 MED ORDER — SERTRALINE HCL 50 MG PO TABS: 50.0000 mg | ORAL_TABLET | Freq: Every day | ORAL | 1 refills | Status: DC

## 2019-06-30 LAB — HM MAMMOGRAPHY: HM Mammogram: NORMAL (ref 0–4)

## 2019-07-02 ENCOUNTER — Other Ambulatory Visit: Payer: Self-pay

## 2019-07-02 ENCOUNTER — Encounter: Payer: Self-pay | Admitting: Internal Medicine

## 2019-07-02 ENCOUNTER — Ambulatory Visit (INDEPENDENT_AMBULATORY_CARE_PROVIDER_SITE_OTHER): Payer: 59 | Admitting: Internal Medicine

## 2019-07-02 ENCOUNTER — Other Ambulatory Visit: Payer: Self-pay | Admitting: Internal Medicine

## 2019-07-02 VITALS — BP 110/78 | HR 66 | Temp 97.6°F | Ht 66.0 in | Wt 289.9 lb

## 2019-07-02 DIAGNOSIS — F17200 Nicotine dependence, unspecified, uncomplicated: Secondary | ICD-10-CM | POA: Diagnosis not present

## 2019-07-02 DIAGNOSIS — R7302 Impaired glucose tolerance (oral): Secondary | ICD-10-CM | POA: Insufficient documentation

## 2019-07-02 DIAGNOSIS — E559 Vitamin D deficiency, unspecified: Secondary | ICD-10-CM

## 2019-07-02 DIAGNOSIS — E538 Deficiency of other specified B group vitamins: Secondary | ICD-10-CM | POA: Insufficient documentation

## 2019-07-02 DIAGNOSIS — F909 Attention-deficit hyperactivity disorder, unspecified type: Secondary | ICD-10-CM

## 2019-07-02 DIAGNOSIS — E119 Type 2 diabetes mellitus without complications: Secondary | ICD-10-CM | POA: Insufficient documentation

## 2019-07-02 DIAGNOSIS — Z0000 Encounter for general adult medical examination without abnormal findings: Secondary | ICD-10-CM

## 2019-07-02 DIAGNOSIS — E785 Hyperlipidemia, unspecified: Secondary | ICD-10-CM | POA: Diagnosis not present

## 2019-07-02 DIAGNOSIS — F411 Generalized anxiety disorder: Secondary | ICD-10-CM

## 2019-07-02 DIAGNOSIS — Z114 Encounter for screening for human immunodeficiency virus [HIV]: Secondary | ICD-10-CM

## 2019-07-02 LAB — CBC WITH DIFFERENTIAL/PLATELET
Basophils Absolute: 0 10*3/uL (ref 0.0–0.1)
Basophils Relative: 0.2 % (ref 0.0–3.0)
Eosinophils Absolute: 0.2 10*3/uL (ref 0.0–0.7)
Eosinophils Relative: 3.5 % (ref 0.0–5.0)
HCT: 42.1 % (ref 36.0–46.0)
Hemoglobin: 14 g/dL (ref 12.0–15.0)
Lymphocytes Relative: 28.8 % (ref 12.0–46.0)
Lymphs Abs: 1.9 10*3/uL (ref 0.7–4.0)
MCHC: 33.3 g/dL (ref 30.0–36.0)
MCV: 86.7 fl (ref 78.0–100.0)
Monocytes Absolute: 0.3 10*3/uL (ref 0.1–1.0)
Monocytes Relative: 4.3 % (ref 3.0–12.0)
Neutro Abs: 4.3 10*3/uL (ref 1.4–7.7)
Neutrophils Relative %: 63.2 % (ref 43.0–77.0)
Platelets: 230 10*3/uL (ref 150.0–400.0)
RBC: 4.86 Mil/uL (ref 3.87–5.11)
RDW: 14.5 % (ref 11.5–15.5)
WBC: 6.7 10*3/uL (ref 4.0–10.5)

## 2019-07-02 LAB — COMPREHENSIVE METABOLIC PANEL
ALT: 28 U/L (ref 0–35)
AST: 18 U/L (ref 0–37)
Albumin: 4.1 g/dL (ref 3.5–5.2)
Alkaline Phosphatase: 72 U/L (ref 39–117)
BUN: 12 mg/dL (ref 6–23)
CO2: 27 mEq/L (ref 19–32)
Calcium: 9.3 mg/dL (ref 8.4–10.5)
Chloride: 102 mEq/L (ref 96–112)
Creatinine, Ser: 0.9 mg/dL (ref 0.40–1.20)
GFR: 68.8 mL/min (ref >60.00)
Glucose, Bld: 121 mg/dL — ABNORMAL HIGH (ref 70–99)
Potassium: 4.2 mEq/L (ref 3.5–5.1)
Sodium: 137 mEq/L (ref 135–145)
Total Bilirubin: 1.6 mg/dL — ABNORMAL HIGH (ref 0.2–1.2)
Total Protein: 6.8 g/dL (ref 6.0–8.3)

## 2019-07-02 LAB — VITAMIN D 25 HYDROXY (VIT D DEFICIENCY, FRACTURES): VITD: 16.99 ng/mL — ABNORMAL LOW (ref 30.00–100.00)

## 2019-07-02 LAB — TSH: TSH: 1.28 u[IU]/mL (ref 0.35–4.50)

## 2019-07-02 LAB — LIPID PANEL
Cholesterol: 221 mg/dL — ABNORMAL HIGH (ref 0–200)
HDL: 38.2 mg/dL — ABNORMAL LOW (ref >39.00)
LDL Cholesterol: 146 mg/dL — ABNORMAL HIGH (ref 0–99)
NonHDL: 182.72
Total CHOL/HDL Ratio: 6
Triglycerides: 186 mg/dL — ABNORMAL HIGH (ref 0.0–149.0)
VLDL: 37.2 mg/dL (ref 0.0–40.0)

## 2019-07-02 LAB — HEMOGLOBIN A1C: Hgb A1c MFr Bld: 6.3 % (ref 4.6–6.5)

## 2019-07-02 LAB — VITAMIN B12: Vitamin B-12: 163 pg/mL — ABNORMAL LOW (ref 211–911)

## 2019-07-02 MED ORDER — VITAMIN D (ERGOCALCIFEROL) 1.25 MG (50000 UNIT) PO CAPS: 50000.0000 [IU] | ORAL_CAPSULE | ORAL | 0 refills | Status: AC

## 2019-07-02 NOTE — Patient Instructions (Addendum)
-Nice seeing you today!!  -Lab work today; will notify you once results are available.  -Consider getting your flu and tetanus vaccines.  -Schedule follow up in 3 months.   Preventive Care 96-41 Years Old, Female Preventive care refers to visits with your health care provider and lifestyle choices that can promote health and wellness. This includes:  A yearly physical exam. This may also be called an annual well check.  Regular dental visits and eye exams.  Immunizations.  Screening for certain conditions.  Healthy lifestyle choices, such as eating a healthy diet, getting regular exercise, not using drugs or products that contain nicotine and tobacco, and limiting alcohol use. What can I expect for my preventive care visit? Physical exam Your health care provider will check your:  Height and weight. This may be used to calculate body mass index (BMI), which tells if you are at a healthy weight.  Heart rate and blood pressure.  Skin for abnormal spots. Counseling Your health care provider may ask you questions about your:  Alcohol, tobacco, and drug use.  Emotional well-being.  Home and relationship well-being.  Sexual activity.  Eating habits.  Work and work Statistician.  Method of birth control.  Menstrual cycle.  Pregnancy history. What immunizations do I need?  Influenza (flu) vaccine  This is recommended every year. Tetanus, diphtheria, and pertussis (Tdap) vaccine  You may need a Td booster every 10 years. Varicella (chickenpox) vaccine  You may need this if you have not been vaccinated. Zoster (shingles) vaccine  You may need this after age 65. Measles, mumps, and rubella (MMR) vaccine  You may need at least one dose of MMR if you were born in 1957 or later. You may also need a second dose. Pneumococcal conjugate (PCV13) vaccine  You may need this if you have certain conditions and were not previously vaccinated. Pneumococcal polysaccharide  (PPSV23) vaccine  You may need one or two doses if you smoke cigarettes or if you have certain conditions. Meningococcal conjugate (MenACWY) vaccine  You may need this if you have certain conditions. Hepatitis A vaccine  You may need this if you have certain conditions or if you travel or work in places where you may be exposed to hepatitis A. Hepatitis B vaccine  You may need this if you have certain conditions or if you travel or work in places where you may be exposed to hepatitis B. Haemophilus influenzae type b (Hib) vaccine  You may need this if you have certain conditions. Human papillomavirus (HPV) vaccine  If recommended by your health care provider, you may need three doses over 6 months. You may receive vaccines as individual doses or as more than one vaccine together in one shot (combination vaccines). Talk with your health care provider about the risks and benefits of combination vaccines. What tests do I need? Blood tests  Lipid and cholesterol levels. These may be checked every 5 years, or more frequently if you are over 76 years old.  Hepatitis C test.  Hepatitis B test. Screening  Lung cancer screening. You may have this screening every year starting at age 13 if you have a 30-pack-year history of smoking and currently smoke or have quit within the past 15 years.  Colorectal cancer screening. All adults should have this screening starting at age 100 and continuing until age 33. Your health care provider may recommend screening at age 34 if you are at increased risk. You will have tests every 1-10 years, depending on your  results and the type of screening test.  Diabetes screening. This is done by checking your blood sugar (glucose) after you have not eaten for a while (fasting). You may have this done every 1-3 years.  Mammogram. This may be done every 1-2 years. Talk with your health care provider about when you should start having regular mammograms. This may  depend on whether you have a family history of breast cancer.  BRCA-related cancer screening. This may be done if you have a family history of breast, ovarian, tubal, or peritoneal cancers.  Pelvic exam and Pap test. This may be done every 3 years starting at age 45. Starting at age 79, this may be done every 5 years if you have a Pap test in combination with an HPV test. Other tests  Sexually transmitted disease (STD) testing.  Bone density scan. This is done to screen for osteoporosis. You may have this scan if you are at high risk for osteoporosis. Follow these instructions at home: Eating and drinking  Eat a diet that includes fresh fruits and vegetables, whole grains, lean protein, and low-fat dairy.  Take vitamin and mineral supplements as recommended by your health care provider.  Do not drink alcohol if: ? Your health care provider tells you not to drink. ? You are pregnant, may be pregnant, or are planning to become pregnant.  If you drink alcohol: ? Limit how much you have to 0-1 drink a day. ? Be aware of how much alcohol is in your drink. In the [place] one drink equals one 12 oz bottle of beer (355 mL), one 5 oz glass of wine (148 mL), or one 1 oz glass of hard liquor (44 mL). Lifestyle  Take daily care of your teeth and gums.  Stay active. Exercise for at least 30 minutes on 5 or more days each week.  Do not use any products that contain nicotine or tobacco, such as cigarettes, e-cigarettes, and chewing tobacco. If you need help quitting, ask your health care provider.  If you are sexually active, practice safe sex. Use a condom or other form of birth control (contraception) in order to prevent pregnancy and STIs (sexually transmitted infections).  If told by your health care provider, take low-dose aspirin daily starting at age 58. What's next?  Visit your health care provider once a year for a well check visit.  Ask your health care provider how often you should  have your eyes and teeth checked.  Stay up to date on all vaccines. This information is not intended to replace advice given to you by your health care provider. Make sure you discuss any questions you have with your health care provider. Document Released: 12/01/2015 Document Revised: 07/16/2018 Document Reviewed: 07/16/2018 Elsevier Patient Education  2020 [employer].

## 2019-07-02 NOTE — Progress Notes (Signed)
Established Patient Office Visit     CC/Reason for Visit: Annual preventive exam  HPI: Angela Townsend is a 43 y.o. female who is coming in today for the above mentioned reasons. Past Medical History is significant for: ADHD on adderall (not due for refills today), GAD, HLD, obesity and ongoing tobacco abuse. She has some skin tags on her neck that she is considering removing. She has no acute complaints. She has routine eye and dental care. We discussed flu and tetanus immunizations. She sees GYN, had a mammo this week. Has been having some spotting and is scheduled for a pelvic US and possibly Bx.  Past Medical/Surgical History: Past Medical History:  Diagnosis Date  . ADD 01/02/2009  . Headache(784.0) 01/02/2009  . HYPERLIPIDEMIA 01/02/2009  . Obesity   . TOBACCO ABUSE 01/02/2009    Past Surgical History:  Procedure Laterality Date  . WISDOM TOOTH EXTRACTION      Social History:  reports that she has been smoking cigarettes. She has been smoking about 0.50 packs per day. She has never used smokeless tobacco. She reports current alcohol use. She reports that she does not use drugs.  Allergies: No Known Allergies  Family History:  Family History  Problem Relation Age of Onset  . Diabetes Mother   . Hyperlipidemia Mother   . Birth defects Maternal Grandmother        breast, colon, brain ca     Current Outpatient Medications:  .  ALPRAZolam (XANAX) 0.5 MG tablet, Take 1 tablet (0.5 mg total) by mouth 3 (three) times daily as needed for anxiety (panic attacks)., Disp: 30 tablet, Rfl: 0 .  amphetamine-dextroamphetamine (ADDERALL XR) 20 MG 24 hr capsule, Take 1 capsule (20 mg total) by mouth every morning., Disp: 30 capsule, Rfl: 0 .  amphetamine-dextroamphetamine (ADDERALL XR) 20 MG 24 hr capsule, Take 1 capsule (20 mg total) by mouth every morning., Disp: 30 capsule, Rfl: 0 .  amphetamine-dextroamphetamine (ADDERALL XR) 20 MG 24 hr capsule, Take 1 capsule (20 mg  total) by mouth every morning., Disp: 30 capsule, Rfl: 0 .  atorvastatin (LIPITOR) 40 MG tablet, Take 1 tablet (40 mg total) by mouth daily at 6 PM., Disp: 90 tablet, Rfl: 1 .  Melatonin 5 MG TABS, Take 5 mg by mouth at bedtime as needed (sleep)., Disp: , Rfl:  .  sertraline (ZOLOFT) 50 MG tablet, Take 1 tablet (50 mg total) by mouth daily., Disp: 90 tablet, Rfl: 1  Review of Systems:  Constitutional: Denies fever, chills, diaphoresis, appetite change and fatigue.  HEENT: Denies photophobia, eye pain, redness, hearing loss, ear pain, congestion, sore throat, rhinorrhea, sneezing, mouth sores, trouble swallowing, neck pain, neck stiffness and tinnitus.   Respiratory: Denies SOB, DOE, cough, chest tightness,  and wheezing.   Cardiovascular: Denies chest pain, palpitations and leg swelling.  Gastrointestinal: Denies nausea, vomiting, abdominal pain, diarrhea, constipation, blood in stool and abdominal distention.  Genitourinary: Denies dysuria, urgency, frequency, hematuria, flank pain and difficulty urinating.  Endocrine: Denies: hot or cold intolerance, sweats, changes in hair or nails, polyuria, polydipsia. Musculoskeletal: Denies myalgias, back pain, joint swelling, arthralgias and gait problem.  Skin: Denies pallor, rash and wound.  Neurological: Denies dizziness, seizures, syncope, weakness, light-headedness, numbness and headaches.  Hematological: Denies adenopathy. Easy bruising, personal or family bleeding history  Psychiatric/Behavioral: Denies suicidal ideation, mood changes, confusion, nervousness, sleep disturbance and agitation    Physical Exam: Vitals:   07/02/19 0802  BP: 110/78  Pulse: 66  Temp: 97.6  F (36.4 C)  TempSrc: Temporal  SpO2: 95%  Weight: 289 lb 14.4 oz (131.5 kg)  Height: _0  (1.676 m)    Body mass index is 46.79 kg/m.   Constitutional: NAD, calm, comfortable Eyes: PERRL, lids and conjunctivae normal ENMT: Mucous membranes are moist. Tympanic  membrane is pearly white, no erythema or bulging. Neck: normal, supple, no masses, no thyromegaly Respiratory: clear to auscultation bilaterally, no wheezing, no crackles. Normal respiratory effort. No accessory muscle use.  Cardiovascular: Regular rate and rhythm, no murmurs / rubs / gallops. No extremity edema. 2+ pedal pulses. No carotid bruits.  Abdomen: no tenderness, no masses palpated. No hepatosplenomegaly. Bowel sounds positive.  Musculoskeletal: no clubbing / cyanosis. No joint deformity upper and lower extremities. Good ROM, no contractures. Normal muscle tone.  Skin: no rashes, lesions, ulcers. No induration Neurologic: CN 2-12 grossly intact. Sensation intact, DTR normal. Strength 5/5 in all 4.  Psychiatric: Normal judgment and insight. Alert and oriented x 3. Normal mood.    Impression and Plan:  Encounter for preventive health examination  -Has routine eye and dental care. -Needs flu and tetanus immunizations but wants to "think about it". -Discussed healthy lifestyle in detail. -Screening labs today. -Commence routine colon cancer screening at age 73. -Had mammo and pap smear this week with GYN.  Dyslipidemia  -Check lipids. -on lipitor.  Attention deficit hyperactivity disorder (ADHD), unspecified ADHD type -On Adderall; not due for refills today.  TOBACCO ABUSE -No time to address today.  Anxiety state -Occasional xanax use.  Morbid obesity ([place]) -Discussed healthy lifestyle, including increased physical activity and better food choices to promote weight loss.  Encounter for screening for HIV  -Plan: HIV antibody (with reflex)    Patient Instructions  -Nice seeing you today!!  -Lab work today; will notify you once results are available.  -Consider getting your flu and tetanus vaccines.  -Schedule follow up in 3 months.   Preventive Care 14-64 Years Old, Female Preventive care refers to visits with your health care provider and lifestyle choices  that can promote health and wellness. This includes:  A yearly physical exam. This may also be called an annual well check.  Regular dental visits and eye exams.  Immunizations.  Screening for certain conditions.  Healthy lifestyle choices, such as eating a healthy diet, getting regular exercise, not using drugs or products that contain nicotine and tobacco, and limiting alcohol use. What can I expect for my preventive care visit? Physical exam Your health care provider will check your:  Height and weight. This may be used to calculate body mass index (BMI), which tells if you are at a healthy weight.  Heart rate and blood pressure.  Skin for abnormal spots. Counseling Your health care provider may ask you questions about your:  Alcohol, tobacco, and drug use.  Emotional well-being.  Home and relationship well-being.  Sexual activity.  Eating habits.  Work and work Statistician.  Method of birth control.  Menstrual cycle.  Pregnancy history. What immunizations do I need?  Influenza (flu) vaccine  This is recommended every year. Tetanus, diphtheria, and pertussis (Tdap) vaccine  You may need a Td booster every 10 years. Varicella (chickenpox) vaccine  You may need this if you have not been vaccinated. Zoster (shingles) vaccine  You may need this after age 6. Measles, mumps, and rubella (MMR) vaccine  You may need at least one dose of MMR if you were born in 1957 or later. You may also need a second dose.  Pneumococcal conjugate (PCV13) vaccine  You may need this if you have certain conditions and were not previously vaccinated. Pneumococcal polysaccharide (PPSV23) vaccine  You may need one or two doses if you smoke cigarettes or if you have certain conditions. Meningococcal conjugate (MenACWY) vaccine  You may need this if you have certain conditions. Hepatitis A vaccine  You may need this if you have certain conditions or if you travel or work in  places where you may be exposed to hepatitis A. Hepatitis B vaccine  You may need this if you have certain conditions or if you travel or work in places where you may be exposed to hepatitis B. Haemophilus influenzae type b (Hib) vaccine  You may need this if you have certain conditions. Human papillomavirus (HPV) vaccine  If recommended by your health care provider, you may need three doses over 6 months. You may receive vaccines as individual doses or as more than one vaccine together in one shot (combination vaccines). Talk with your health care provider about the risks and benefits of combination vaccines. What tests do I need? Blood tests  Lipid and cholesterol levels. These may be checked every 5 years, or more frequently if you are over 41 years old.  Hepatitis C test.  Hepatitis B test. Screening  Lung cancer screening. You may have this screening every year starting at age 35 if you have a 30-pack-year history of smoking and currently smoke or have quit within the past 15 years.  Colorectal cancer screening. All adults should have this screening starting at age 67 and continuing until age 67. Your health care provider may recommend screening at age 7 if you are at increased risk. You will have tests every 1-10 years, depending on your results and the type of screening test.  Diabetes screening. This is done by checking your blood sugar (glucose) after you have not eaten for a while (fasting). You may have this done every 1-3 years.  Mammogram. This may be done every 1-2 years. Talk with your health care provider about when you should start having regular mammograms. This may depend on whether you have a family history of breast cancer.  BRCA-related cancer screening. This may be done if you have a family history of breast, ovarian, tubal, or peritoneal cancers.  Pelvic exam and Pap test. This may be done every 3 years starting at age 70. Starting at age 34, this may be done  every 5 years if you have a Pap test in combination with an HPV test. Other tests  Sexually transmitted disease (STD) testing.  Bone density scan. This is done to screen for osteoporosis. You may have this scan if you are at high risk for osteoporosis. Follow these instructions at home: Eating and drinking  Eat a diet that includes fresh fruits and vegetables, whole grains, lean protein, and low-fat dairy.  Take vitamin and mineral supplements as recommended by your health care provider.  Do not drink alcohol if: ? Your health care provider tells you not to drink. ? You are pregnant, may be pregnant, or are planning to become pregnant.  If you drink alcohol: ? Limit how much you have to 0-1 drink a day. ? Be aware of how much alcohol is in your drink. In the [place] one drink equals one 12 oz bottle of beer (355 mL), one 5 oz glass of wine (148 mL), or one 1 oz glass of hard liquor (44 mL). Lifestyle  Take daily care of your teeth  and gums.  Stay active. Exercise for at least 30 minutes on 5 or more days each week.  Do not use any products that contain nicotine or tobacco, such as cigarettes, e-cigarettes, and chewing tobacco. If you need help quitting, ask your health care provider.  If you are sexually active, practice safe sex. Use a condom or other form of birth control (contraception) in order to prevent pregnancy and STIs (sexually transmitted infections).  If told by your health care provider, take low-dose aspirin daily starting at age 12. What's next?  Visit your health care provider once a year for a well check visit.  Ask your health care provider how often you should have your eyes and teeth checked.  Stay up to date on all vaccines. This information is not intended to replace advice given to you by your health care provider. Make sure you discuss any questions you have with your health care provider. Document Released: 12/01/2015 Document Revised: 07/16/2018  Document Reviewed: 07/16/2018 Elsevier Patient Education  2020 [place], [place] [facility]

## 2019-07-02 NOTE — Addendum Note (Signed)
Addended by: Elmer Picker on: 07/02/2019 08:38 AM   Modules accepted: Orders

## 2019-07-03 LAB — HIV ANTIBODY (ROUTINE TESTING W REFLEX): HIV 1&2 Ab, 4th Generation: NONREACTIVE

## 2019-07-06 ENCOUNTER — Other Ambulatory Visit: Payer: Self-pay | Admitting: Internal Medicine

## 2019-07-06 DIAGNOSIS — E559 Vitamin D deficiency, unspecified: Secondary | ICD-10-CM

## 2019-07-16 ENCOUNTER — Ambulatory Visit: Payer: Self-pay | Admitting: Psychology

## 2019-07-20 ENCOUNTER — Other Ambulatory Visit: Payer: Self-pay | Admitting: Internal Medicine

## 2019-07-20 DIAGNOSIS — F909 Attention-deficit hyperactivity disorder, unspecified type: Secondary | ICD-10-CM

## 2019-07-20 MED ORDER — AMPHETAMINE-DEXTROAMPHET ER 20 MG PO CP24: 20.0000 mg | ORAL_CAPSULE | ORAL | 0 refills | Status: DC

## 2019-07-20 NOTE — Telephone Encounter (Signed)
Patient requesting refill. 

## 2019-08-31 ENCOUNTER — Other Ambulatory Visit: Payer: Self-pay | Admitting: Internal Medicine

## 2019-08-31 DIAGNOSIS — F909 Attention-deficit hyperactivity disorder, unspecified type: Secondary | ICD-10-CM

## 2019-10-06 ENCOUNTER — Other Ambulatory Visit: Payer: Self-pay | Admitting: Internal Medicine

## 2019-10-06 DIAGNOSIS — F411 Generalized anxiety disorder: Secondary | ICD-10-CM

## 2019-10-06 DIAGNOSIS — E559 Vitamin D deficiency, unspecified: Secondary | ICD-10-CM

## 2019-10-07 MED ORDER — ALPRAZOLAM 0.5 MG PO TABS: 0.5000 mg | ORAL_TABLET | Freq: Three times a day (TID) | ORAL | 0 refills | Status: DC | PRN

## 2019-10-13 ENCOUNTER — Other Ambulatory Visit: Payer: Self-pay | Admitting: Internal Medicine

## 2019-10-13 DIAGNOSIS — F909 Attention-deficit hyperactivity disorder, unspecified type: Secondary | ICD-10-CM

## 2019-10-13 DIAGNOSIS — E785 Hyperlipidemia, unspecified: Secondary | ICD-10-CM

## 2019-10-15 NOTE — Telephone Encounter (Signed)
Forwarding to PCP for approval. Last OV 06/2019

## 2019-10-16 ENCOUNTER — Other Ambulatory Visit: Payer: Self-pay | Admitting: Internal Medicine

## 2019-10-16 DIAGNOSIS — F909 Attention-deficit hyperactivity disorder, unspecified type: Secondary | ICD-10-CM

## 2019-10-18 NOTE — Telephone Encounter (Signed)
Routing to PCP for approval.

## 2019-10-19 MED ORDER — AMPHETAMINE-DEXTROAMPHET ER 20 MG PO CP24: 20.0000 mg | ORAL_CAPSULE | ORAL | 0 refills | Status: DC

## 2019-10-19 NOTE — Telephone Encounter (Signed)
Called patient and informed she needs a f/u appt. Patient scheduled for Friday. Patient states she only has one pill left. Can she get enough medication to last until then.

## 2019-10-21 NOTE — Telephone Encounter (Signed)
OV scheduled for 10/22/2019

## 2019-10-22 ENCOUNTER — Other Ambulatory Visit: Payer: Self-pay

## 2019-10-22 ENCOUNTER — Telehealth (INDEPENDENT_AMBULATORY_CARE_PROVIDER_SITE_OTHER): Payer: 59 | Admitting: Internal Medicine

## 2019-10-22 DIAGNOSIS — F909 Attention-deficit hyperactivity disorder, unspecified type: Secondary | ICD-10-CM | POA: Diagnosis not present

## 2019-10-22 DIAGNOSIS — E538 Deficiency of other specified B group vitamins: Secondary | ICD-10-CM | POA: Diagnosis not present

## 2019-10-22 DIAGNOSIS — E559 Vitamin D deficiency, unspecified: Secondary | ICD-10-CM | POA: Diagnosis not present

## 2019-10-22 MED ORDER — AMPHETAMINE-DEXTROAMPHET ER 20 MG PO CP24: 20.0000 mg | ORAL_CAPSULE | ORAL | 0 refills | Status: DC

## 2019-10-22 NOTE — Progress Notes (Signed)
Virtual Visit via Video Note  I connected with Angela Townsend on 10/22/19 at 11:30 AM EST by a video enabled telemedicine application and verified that I am speaking with the correct person using two identifiers.  Location patient: home Location provider: work office Persons participating in the virtual visit: patient, provider  I discussed the limitations of evaluation and management by telemedicine and the availability of in person appointments. The patient expressed understanding and agreed to proceed.   HPI: This is a scheduled visit for Adderall refills per contract.  She has had no new issues.  Is tolerating Adderall well.  She has 2 more pills of her vitamin D and is planning on coming in for lab visit and wants to know if at that same time she can have her vitamin B12 injection.   ROS: Constitutional: Denies fever, chills, diaphoresis, appetite change and fatigue.  HEENT: Denies photophobia, eye pain, redness, hearing loss, ear pain, congestion, sore throat, rhinorrhea, sneezing, mouth sores, trouble swallowing, neck pain, neck stiffness and tinnitus.   Respiratory: Denies SOB, DOE, cough, chest tightness,  and wheezing.   Cardiovascular: Denies chest pain, palpitations and leg swelling.  Gastrointestinal: Denies nausea, vomiting, abdominal pain, diarrhea, constipation, blood in stool and abdominal distention.  Genitourinary: Denies dysuria, urgency, frequency, hematuria, flank pain and difficulty urinating.  Endocrine: Denies: hot or cold intolerance, sweats, changes in hair or nails, polyuria, polydipsia. Musculoskeletal: Denies myalgias, back pain, joint swelling, arthralgias and gait problem.  Skin: Denies pallor, rash and wound.  Neurological: Denies dizziness, seizures, syncope, weakness, light-headedness, numbness and headaches.  Hematological: Denies adenopathy. Easy bruising, personal or family bleeding history  Psychiatric/Behavioral: Denies suicidal ideation,  mood changes, confusion, nervousness, sleep disturbance and agitation   Past Medical History:  Diagnosis Date  . ADD 01/02/2009  . Headache(784.0) 01/02/2009  . HYPERLIPIDEMIA 01/02/2009  . Obesity   . TOBACCO ABUSE 01/02/2009    Past Surgical History:  Procedure Laterality Date  . WISDOM TOOTH EXTRACTION      Family History  Problem Relation Age of Onset  . Diabetes Mother   . Hyperlipidemia Mother   . Birth defects Maternal Grandmother        breast, colon, brain ca    SOCIAL HX:   reports that she has been smoking cigarettes. She has been smoking about 0.50 packs per day. She has never used smokeless tobacco. She reports current alcohol use. She reports that she does not use drugs.   Current Outpatient Medications:  .  ALPRAZolam (XANAX) 0.5 MG tablet, Take 1 tablet (0.5 mg total) by mouth 3 (three) times daily as needed for anxiety (panic attacks)., Disp: 30 tablet, Rfl: 0 .  amphetamine-dextroamphetamine (ADDERALL XR) 20 MG 24 hr capsule, Take 1 capsule (20 mg total) by mouth every morning for 5 days., Disp: 5 capsule, Rfl: 0 .  amphetamine-dextroamphetamine (ADDERALL XR) 20 MG 24 hr capsule, Take 1 capsule (20 mg total) by mouth every morning., Disp: 30 capsule, Rfl: 0 .  amphetamine-dextroamphetamine (ADDERALL XR) 20 MG 24 hr capsule, Take 1 capsule (20 mg total) by mouth every morning., Disp: 30 capsule, Rfl: 0 .  amphetamine-dextroamphetamine (ADDERALL XR) 20 MG 24 hr capsule, Take 1 capsule (20 mg total) by mouth every morning., Disp: 30 capsule, Rfl: 0 .  atorvastatin (LIPITOR) 40 MG tablet, TAKE 1 TABLET (40 MG TOTAL) BY MOUTH DAILY AT 6 PM., Disp: 90 tablet, Rfl: 1 .  Melatonin 5 MG TABS, Take 5 mg by mouth at bedtime as  needed (sleep)., Disp: , Rfl:  .  sertraline (ZOLOFT) 50 MG tablet, Take 1 tablet (50 mg total) by mouth daily., Disp: 90 tablet, Rfl: 1  EXAM:   VITALS per patient if applicable: None reported  GENERAL: alert, oriented, appears well and in no  acute distress  HEENT: atraumatic, conjunttiva clear, no obvious abnormalities on inspection of external nose and ears  NECK: normal movements of the head and neck  LUNGS: on inspection no signs of respiratory distress, breathing rate appears normal, no obvious gross increased work of breathing, gasping or wheezing  CV: no obvious cyanosis  MS: moves all visible extremities without noticeable abnormality  PSYCH/NEURO: pleasant and cooperative, no obvious depression or anxiety, speech and thought processing grossly intact  ASSESSMENT AND PLAN:   Vitamin B12 deficiency -She will call for a nurse visit to have her first injection.  Attention deficit hyperactivity disorder (ADHD), unspecified ADHD type  -PDMP has been reviewed.  No red flags, overdose risk score is 20. -Refill Adderall 20 mg XR x3 months.  Vitamin D deficiency -When she completes 2 more weeks she will come in for repeat vitamin D level to determine further supplementation needs.     I discussed the assessment and treatment plan with the patient. The patient was provided an opportunity to ask questions and all were answered. The patient agreed with the plan and demonstrated an understanding of the instructions.   The patient was advised to call back or seek an in-person evaluation if the symptoms worsen or if the condition fails to improve as anticipated.    Chaya Jan, MD  [facility]

## 2019-12-26 ENCOUNTER — Other Ambulatory Visit: Payer: Self-pay | Admitting: Internal Medicine

## 2019-12-26 DIAGNOSIS — F411 Generalized anxiety disorder: Secondary | ICD-10-CM

## 2020-01-04 ENCOUNTER — Encounter: Payer: Self-pay | Admitting: Internal Medicine

## 2020-01-07 ENCOUNTER — Other Ambulatory Visit: Payer: Self-pay

## 2020-01-07 ENCOUNTER — Telehealth (INDEPENDENT_AMBULATORY_CARE_PROVIDER_SITE_OTHER): Payer: 59 | Admitting: Family Medicine

## 2020-01-07 DIAGNOSIS — W540XXA Bitten by dog, initial encounter: Secondary | ICD-10-CM

## 2020-01-07 DIAGNOSIS — L03011 Cellulitis of right finger: Secondary | ICD-10-CM | POA: Diagnosis not present

## 2020-01-07 MED ORDER — AMOXICILLIN-POT CLAVULANATE 875-125 MG PO TABS: 1.0000 | ORAL_TABLET | Freq: Two times a day (BID) | ORAL | 0 refills | Status: DC

## 2020-01-07 NOTE — Progress Notes (Signed)
This visit type was conducted due to national recommendations for restrictions regarding the COVID-19 pandemic in an effort to limit this patient's exposure and mitigate transmission in our community.   Virtual Visit via Video Note  I connected with Angela Townsend on 01/07/20 at  4:00 PM EST by a video enabled telemedicine application and verified that I am speaking with the correct person using two identifiers.  Location patient: home Location provider:work or home office Persons participating in the virtual visit: patient, provider  I discussed the limitations of evaluation and management by telemedicine and the availability of in person appointments. The patient expressed understanding and agreed to proceed.   HPI: Takira had called regarding dog bite which occurred 10 days ago.  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  She had puncture wounds both sites.  She had bleeding both in the wrist and finger at the time of the injury.  She states that dog has been fully vaccinated.  She called animal control to verify.  Last tetanus unknown.  She has been cleaning wounds daily with hydrogen peroxide.  Her wrist wound has basically healed without difficulty.  She removed a scab from her right fourth digit and noticed some swelling and tenderness and increased redness today.  She also saw a little bit of puslike drainage when she pulled the scab off.  She has not had any fever or chills.  Full range of motion of the finger.  No known drug allergies.   ROS: See pertinent positives and negatives per HPI.  Past Medical History:  Diagnosis Date  . ADD 01/02/2009  . Headache(784.0) 01/02/2009  . HYPERLIPIDEMIA 01/02/2009  . Obesity   . TOBACCO ABUSE 01/02/2009    Past Surgical History:  Procedure Laterality Date  . WISDOM TOOTH EXTRACTION      Family History  Problem Relation Age of  Onset  . Diabetes Mother   . Hyperlipidemia Mother   . Birth defects Maternal Grandmother        breast, colon, brain ca    SOCIAL HX: History of nicotine use   Current Outpatient Medications:  .  ALPRAZolam (XANAX) 0.5 MG tablet, Take 1 tablet (0.5 mg total) by mouth 3 (three) times daily as needed for anxiety (panic attacks)., Disp: 30 tablet, Rfl: 0 .  amoxicillin-clavulanate (AUGMENTIN) 875-125 MG tablet, Take 1 tablet by mouth 2 (two) times daily., Disp: 14 tablet, Rfl: 0 .  amphetamine-dextroamphetamine (ADDERALL XR) 20 MG 24 hr capsule, Take 1 capsule (20 mg total) by mouth every morning for 5 days., Disp: 5 capsule, Rfl: 0 .  amphetamine-dextroamphetamine (ADDERALL XR) 20 MG 24 hr capsule, Take 1 capsule (20 mg total) by mouth every morning., Disp: 30 capsule, Rfl: 0 .  amphetamine-dextroamphetamine (ADDERALL XR) 20 MG 24 hr capsule, Take 1 capsule (20 mg total) by mouth every morning., Disp: 30 capsule, Rfl: 0 .  amphetamine-dextroamphetamine (ADDERALL XR) 20 MG 24 hr capsule, Take 1 capsule (20 mg total) by mouth every morning., Disp: 30 capsule, Rfl: 0 .  atorvastatin (LIPITOR) 40 MG tablet, TAKE 1 TABLET (40 MG TOTAL) BY MOUTH DAILY AT 6 PM., Disp: 90 tablet, Rfl: 1 .  Melatonin 5 MG TABS, Take 5 mg by mouth at bedtime as needed (sleep)., Disp: , Rfl:  .  sertraline (ZOLOFT) 50 MG tablet, TAKE 1 TABLET BY MOUTH EVERY DAY, Disp: 90 tablet, Rfl: 1  EXAM:  VITALS per patient if applicable:  GENERAL: alert, oriented, appears well and in no acute distress  HEENT: atraumatic, conjunttiva clear, no obvious abnormalities on inspection of external nose and ears  NECK: normal movements of the head and neck  LUNGS: on inspection no signs of respiratory distress, breathing rate appears normal, no obvious gross SOB, gasping or wheezing  CV: no obvious cyanosis  MS: moves all visible extremities without noticeable abnormality  PSYCH/NEURO: pleasant and cooperative, no obvious  depression or anxiety, speech and thought processing grossly intact  ASSESSMENT AND PLAN:  Discussed the following assessment and plan:  Recent reported dog bite about 10 days ago.  She now has some redness and mild swelling right fourth digit with wound about midway between the DIP and PIP joint on the medial aspect.  Does appear to have some mild surrounding cellulitis changes.  She has full range of motion of the finger and she does not describe any fluctuance to suggest likely abscess.  We recommend the following  -Warm salt water soaks at least twice daily next few days -Start Augmentin 875 mg twice daily for 7 days -Confirm date of last tetanus.  We recommend a tetanus booster next time she sees Dr. Ardyth Harps if this has been over 10 years -Follow-up promptly for any fever or increased redness or swelling     I discussed the assessment and treatment plan with the patient. The patient was provided an opportunity to ask questions and all were answered. The patient agreed with the plan and demonstrated an understanding of the instructions.   The patient was advised to call back or seek an in-person evaluation if the symptoms worsen or if the condition fails to improve as anticipated.     Evelena Peat, MD

## 2020-01-12 ENCOUNTER — Other Ambulatory Visit: Payer: Self-pay | Admitting: Family Medicine

## 2020-01-12 DIAGNOSIS — E785 Hyperlipidemia, unspecified: Secondary | ICD-10-CM

## 2020-01-12 MED ORDER — ATORVASTATIN CALCIUM 40 MG PO TABS: 40.0000 mg | ORAL_TABLET | Freq: Every day | ORAL | 0 refills | Status: DC

## 2020-01-12 NOTE — Telephone Encounter (Signed)
Spoke with the pt and informed her of the message below.  Patient stated she clicked on the wrong medication as she needs a refill on Atorvastatin, not Augmentin.  Rx denial sent and refill request sent per pts request.

## 2020-01-12 NOTE — Telephone Encounter (Signed)
May refill once.   If redness not resolved after that recommend office follow up to reassess.

## 2020-01-13 ENCOUNTER — Other Ambulatory Visit: Payer: Self-pay

## 2020-01-14 ENCOUNTER — Ambulatory Visit (INDEPENDENT_AMBULATORY_CARE_PROVIDER_SITE_OTHER): Payer: 59 | Admitting: Family Medicine

## 2020-01-14 ENCOUNTER — Other Ambulatory Visit: Payer: 59

## 2020-01-14 ENCOUNTER — Encounter: Payer: Self-pay | Admitting: Family Medicine

## 2020-01-14 VITALS — BP 102/78 | HR 104 | Temp 97.2°F | Wt 288.8 lb

## 2020-01-14 DIAGNOSIS — Q828 Other specified congenital malformations of skin: Secondary | ICD-10-CM | POA: Diagnosis not present

## 2020-01-14 DIAGNOSIS — E785 Hyperlipidemia, unspecified: Secondary | ICD-10-CM

## 2020-01-14 DIAGNOSIS — E559 Vitamin D deficiency, unspecified: Secondary | ICD-10-CM | POA: Diagnosis not present

## 2020-01-14 LAB — VITAMIN D 25 HYDROXY (VIT D DEFICIENCY, FRACTURES): VITD: 18.7 ng/mL — ABNORMAL LOW (ref 30.00–100.00)

## 2020-01-14 MED ORDER — ATORVASTATIN CALCIUM 40 MG PO TABS: 40.0000 mg | ORAL_TABLET | Freq: Every day | ORAL | 1 refills | Status: DC

## 2020-01-14 NOTE — Progress Notes (Signed)
  Subjective:     Patient ID: Angela Townsend, female   DOB: [birth year], 44 y.o.   MRN: [id number]  HPI   Wynona Canes is seen with irritated skin tag right side of neck.  She thinks she may have caught this on some jewelry or clothing recently.  She has some darkened surface and would like to get rid of this.  She has a few others in this region.  They are sometimes irritated because of clothing and jewelry.  She had recent cellulitis involving her finger from dog bite and that did improve with Augmentin.  Past Medical History:  Diagnosis Date  . ADD 01/02/2009  . Headache(784.0) 01/02/2009  . HYPERLIPIDEMIA 01/02/2009  . Obesity   . TOBACCO ABUSE 01/02/2009   Past Surgical History:  Procedure Laterality Date  . WISDOM TOOTH EXTRACTION      reports that she has been smoking cigarettes. She has been smoking about 0.50 packs per day. She has never used smokeless tobacco. She reports current alcohol use. She reports that she does not use drugs. family history includes Birth defects in her maternal grandmother; Diabetes in her mother; Hyperlipidemia in her mother. No Known Allergies   Review of Systems  Constitutional: Negative for fever.  Respiratory: Negative for cough and shortness of breath.        Objective:   Physical Exam Vitals reviewed.  Constitutional:      Appearance: Normal appearance.  Cardiovascular:     Rate and Rhythm: Normal rate and regular rhythm.  Skin:    Comments: She has irritated skin tag right lower neck and has 3 other skin tags that are superior to this these are all relatively small about 2 mm in length  Neurological:     Mental Status: She is alert.        Assessment:     Irritated skin tag right side of neck    Plan:     -We discussed treatment with liquid nitrogen including risk of pain and possible blistering and low risk of infection patient consented.  We treated a total of 4 skin tags right side of neck and patient tolerated well.   Keep clean with soap and water.  Follow-up as needed.  Kristian Covey MD [facility] at [facility]

## 2020-01-14 NOTE — Patient Instructions (Signed)
Skin Tag, Adult  A skin tag (acrochordon) is a soft, extra growth of skin. Most skin tags are flesh-colored and rarely bigger than a pencil eraser. They commonly form near areas where there are folds in the skin, such as the armpit or groin. Skin tags are not dangerous, and they do not spread from person to person (are not contagious). You may have one skin tag or several. Skin tags do not require treatment. However, your health care provider may recommend removal of a skin tag if it:  Gets irritated from clothing.  Bleeds.  Is visible and unsightly. Your health care provider can remove skin tags with a simple surgical procedure or a procedure that involves freezing the skin tag. Follow these instructions at home:  Watch for any changes in your skin tag. A normal skin tag does not require any other special care at home.  Take over-the-counter and prescription medicines only as told by your health care provider.  Keep all follow-up visits as told by your health care provider. This is important. Contact a health care provider if:  You have a skin tag that: ? Becomes painful. ? Changes color. ? Bleeds. ? Swells.  You develop more skin tags. This information is not intended to replace advice given to you by your health care provider. Make sure you discuss any questions you have with your health care provider. Document Revised: 10/17/2017 Document Reviewed: 11/19/2015 Elsevier Patient Education  2020 Elsevier Inc[employer]  

## 2020-02-01 ENCOUNTER — Other Ambulatory Visit: Payer: Self-pay | Admitting: Internal Medicine

## 2020-02-01 DIAGNOSIS — E559 Vitamin D deficiency, unspecified: Secondary | ICD-10-CM

## 2020-02-01 MED ORDER — VITAMIN D (ERGOCALCIFEROL) 1.25 MG (50000 UNIT) PO CAPS: 50000.0000 [IU] | ORAL_CAPSULE | ORAL | 0 refills | Status: AC

## 2020-02-03 ENCOUNTER — Other Ambulatory Visit: Payer: Self-pay | Admitting: Internal Medicine

## 2020-02-03 DIAGNOSIS — F411 Generalized anxiety disorder: Secondary | ICD-10-CM

## 2020-02-03 DIAGNOSIS — F909 Attention-deficit hyperactivity disorder, unspecified type: Secondary | ICD-10-CM

## 2020-02-03 NOTE — Telephone Encounter (Signed)
Please see Rx refill request

## 2020-02-03 NOTE — Telephone Encounter (Signed)
Please deny.  Patient needs an office visit  °

## 2020-02-03 NOTE — Telephone Encounter (Signed)
Forwarding to PCP CMA.  

## 2020-02-04 MED ORDER — ALPRAZOLAM 0.5 MG PO TABS: 0.5000 mg | ORAL_TABLET | Freq: Three times a day (TID) | ORAL | 0 refills | Status: DC | PRN

## 2020-03-23 ENCOUNTER — Other Ambulatory Visit: Payer: Self-pay | Admitting: Internal Medicine

## 2020-03-23 DIAGNOSIS — F909 Attention-deficit hyperactivity disorder, unspecified type: Secondary | ICD-10-CM

## 2020-03-23 NOTE — Telephone Encounter (Signed)
Last OV for this 10/2019

## 2020-03-23 NOTE — Telephone Encounter (Signed)
Please deny this refill.  Patient will need an office visit.

## 2020-03-25 ENCOUNTER — Encounter: Payer: Self-pay | Admitting: Internal Medicine

## 2020-03-29 NOTE — Telephone Encounter (Signed)
I have the patient scheduled for a MyChart visit on 03/31/2020 at 4 PM.  She is completely out of her Rx for amphetamine-dextroamphetamine (ADDERALL XR) 20 MG 24 hr capsule   She may also need a Rx called in for  atorvastatin (LIPITOR) 40 MG tablet  [id number] [place], [place] - [address] Phone:  [phone]  Fax:  [phone]

## 2020-03-31 ENCOUNTER — Encounter: Payer: Self-pay | Admitting: Internal Medicine

## 2020-03-31 ENCOUNTER — Telehealth (INDEPENDENT_AMBULATORY_CARE_PROVIDER_SITE_OTHER): Payer: 59 | Admitting: Internal Medicine

## 2020-03-31 VITALS — Wt 286.0 lb

## 2020-03-31 DIAGNOSIS — F909 Attention-deficit hyperactivity disorder, unspecified type: Secondary | ICD-10-CM

## 2020-03-31 MED ORDER — AMPHETAMINE-DEXTROAMPHET ER 20 MG PO CP24: 20.0000 mg | ORAL_CAPSULE | ORAL | 0 refills | Status: DC

## 2020-03-31 NOTE — Progress Notes (Signed)
Virtual Visit via Video Note  I connected with Angela Townsend on 03/31/20 at  4:00 PM EDT by a video enabled telemedicine application and verified that I am speaking with the correct person using two identifiers.  Location patient: home Location provider: work office Persons participating in the virtual visit: patient, provider  I discussed the limitations of evaluation and management by telemedicine and the availability of in person appointments. The patient expressed understanding and agreed to proceed.   HPI: Scheduled visit per protocol for adderall refills. She is on 20 mg daily. Has been doing well without issues. She has questions about the COVID vaccine and whether she should receive it.   ROS: Constitutional: Denies fever, chills, diaphoresis, appetite change and fatigue.  HEENT: Denies photophobia, eye pain, redness, hearing loss, ear pain, congestion, sore throat, rhinorrhea, sneezing, mouth sores, trouble swallowing, neck pain, neck stiffness and tinnitus.   Respiratory: Denies SOB, DOE, cough, chest tightness,  and wheezing.   Cardiovascular: Denies chest pain, palpitations and leg swelling.  Gastrointestinal: Denies nausea, vomiting, abdominal pain, diarrhea, constipation, blood in stool and abdominal distention.  Genitourinary: Denies dysuria, urgency, frequency, hematuria, flank pain and difficulty urinating.  Endocrine: Denies: hot or cold intolerance, sweats, changes in hair or nails, polyuria, polydipsia. Musculoskeletal: Denies myalgias, back pain, joint swelling, arthralgias and gait problem.  Skin: Denies pallor, rash and wound.  Neurological: Denies dizziness, seizures, syncope, weakness, light-headedness, numbness and headaches.  Hematological: Denies adenopathy. Easy bruising, personal or family bleeding history  Psychiatric/Behavioral: Denies suicidal ideation, mood changes, confusion, nervousness, sleep disturbance and agitation   Past Medical  History:  Diagnosis Date  . ADD 01/02/2009  . Headache(784.0) 01/02/2009  . HYPERLIPIDEMIA 01/02/2009  . Obesity   . TOBACCO ABUSE 01/02/2009    Past Surgical History:  Procedure Laterality Date  . WISDOM TOOTH EXTRACTION      Family History  Problem Relation Age of Onset  . Diabetes Mother   . Hyperlipidemia Mother   . Birth defects Maternal Grandmother        breast, colon, brain ca    SOCIAL HX:   reports that she has been smoking cigarettes. She has been smoking about 0.50 packs per day. She has never used smokeless tobacco. She reports current alcohol use. She reports that she does not use drugs.   Current Outpatient Medications:  .  ALPRAZolam (XANAX) 0.5 MG tablet, Take 1 tablet (0.5 mg total) by mouth 3 (three) times daily as needed for anxiety (panic attacks)., Disp: 30 tablet, Rfl: 0 .  amphetamine-dextroamphetamine (ADDERALL XR) 20 MG 24 hr capsule, Take 1 capsule (20 mg total) by mouth every morning., Disp: 30 capsule, Rfl: 0 .  amphetamine-dextroamphetamine (ADDERALL XR) 20 MG 24 hr capsule, Take 1 capsule (20 mg total) by mouth every morning., Disp: 30 capsule, Rfl: 0 .  amphetamine-dextroamphetamine (ADDERALL XR) 20 MG 24 hr capsule, Take 1 capsule (20 mg total) by mouth every morning., Disp: 30 capsule, Rfl: 0 .  atorvastatin (LIPITOR) 40 MG tablet, Take 1 tablet (40 mg total) by mouth daily at 6 PM., Disp: 90 tablet, Rfl: 1 .  Melatonin 5 MG TABS, Take 5 mg by mouth at bedtime as needed (sleep)., Disp: , Rfl:  .  sertraline (ZOLOFT) 50 MG tablet, TAKE 1 TABLET BY MOUTH EVERY DAY, Disp: 90 tablet, Rfl: 1 .  Vitamin D, Ergocalciferol, (DRISDOL) 1.25 MG (50000 UNIT) CAPS capsule, Take 1 capsule (50,000 Units total) by mouth every 7 (seven) days for 12 doses.,  Disp: 12 capsule, Rfl: 0  EXAM:   VITALS per patient if applicable: non reported  GENERAL: alert, oriented, appears well and in no acute distress  HEENT: atraumatic, conjunttiva clear, no obvious abnormalities  on inspection of external nose and ears  NECK: normal movements of the head and neck  LUNGS: on inspection no signs of respiratory distress, breathing rate appears normal, no obvious gross increased work of breathing, gasping or wheezing  CV: no obvious cyanosis  MS: moves all visible extremities without noticeable abnormality  PSYCH/NEURO: pleasant and cooperative, no obvious depression or anxiety, speech and thought processing grossly intact  ASSESSMENT AND PLAN:   Attention deficit hyperactivity disorder (ADHD), unspecified ADHD type  -PDMP reviewed, no red flags, ORS 30. -Refill Adderall 20 mg QD x 3 months.  Have answered questions regarding COVID vaccine to the best of my ability and have encouraged her to receive it.    I discussed the assessment and treatment plan with the patient. The patient was provided an opportunity to ask questions and all were answered. The patient agreed with the plan and demonstrated an understanding of the instructions.   The patient was advised to call back or seek an in-person evaluation if the symptoms worsen or if the condition fails to improve as anticipated.    Lelon Frohlich, MD  [facility] at [facility]

## 2020-04-04 ENCOUNTER — Other Ambulatory Visit: Payer: Self-pay | Admitting: Internal Medicine

## 2020-04-04 DIAGNOSIS — F909 Attention-deficit hyperactivity disorder, unspecified type: Secondary | ICD-10-CM

## 2020-04-05 ENCOUNTER — Encounter: Payer: Self-pay | Admitting: Internal Medicine

## 2020-04-05 NOTE — Telephone Encounter (Signed)
Already sent.  Please deny and use what is on file.  thanks

## 2020-07-02 ENCOUNTER — Other Ambulatory Visit: Payer: Self-pay | Admitting: Internal Medicine

## 2020-07-02 DIAGNOSIS — F411 Generalized anxiety disorder: Secondary | ICD-10-CM

## 2020-07-03 ENCOUNTER — Other Ambulatory Visit: Payer: Self-pay | Admitting: Internal Medicine

## 2020-07-03 DIAGNOSIS — F411 Generalized anxiety disorder: Secondary | ICD-10-CM

## 2020-07-04 ENCOUNTER — Other Ambulatory Visit: Payer: Self-pay | Admitting: Internal Medicine

## 2020-07-04 DIAGNOSIS — E785 Hyperlipidemia, unspecified: Secondary | ICD-10-CM

## 2020-07-04 MED ORDER — ALPRAZOLAM 0.5 MG PO TABS: 0.5000 mg | ORAL_TABLET | Freq: Three times a day (TID) | ORAL | 0 refills | Status: DC | PRN

## 2020-08-15 ENCOUNTER — Other Ambulatory Visit: Payer: Self-pay | Admitting: Internal Medicine

## 2020-08-15 DIAGNOSIS — F909 Attention-deficit hyperactivity disorder, unspecified type: Secondary | ICD-10-CM

## 2020-08-16 NOTE — Telephone Encounter (Signed)
Please deny.  Patient needs an office visit  °

## 2020-08-18 ENCOUNTER — Other Ambulatory Visit: Payer: Self-pay | Admitting: Internal Medicine

## 2020-08-18 DIAGNOSIS — F909 Attention-deficit hyperactivity disorder, unspecified type: Secondary | ICD-10-CM

## 2020-08-18 NOTE — Telephone Encounter (Signed)
Please deny.  Patient will need an office visit. °

## 2020-08-22 ENCOUNTER — Telehealth (INDEPENDENT_AMBULATORY_CARE_PROVIDER_SITE_OTHER): Payer: 59 | Admitting: Internal Medicine

## 2020-08-22 DIAGNOSIS — F909 Attention-deficit hyperactivity disorder, unspecified type: Secondary | ICD-10-CM | POA: Diagnosis not present

## 2020-08-22 DIAGNOSIS — E785 Hyperlipidemia, unspecified: Secondary | ICD-10-CM | POA: Diagnosis not present

## 2020-08-22 DIAGNOSIS — F411 Generalized anxiety disorder: Secondary | ICD-10-CM | POA: Diagnosis not present

## 2020-08-22 MED ORDER — AMPHETAMINE-DEXTROAMPHET ER 20 MG PO CP24: 20.0000 mg | ORAL_CAPSULE | ORAL | 0 refills | Status: DC

## 2020-08-22 MED ORDER — SERTRALINE HCL 50 MG PO TABS: 50.0000 mg | ORAL_TABLET | Freq: Every day | ORAL | 1 refills | Status: DC

## 2020-08-22 MED ORDER — ATORVASTATIN CALCIUM 40 MG PO TABS: 40.0000 mg | ORAL_TABLET | Freq: Every day | ORAL | 1 refills | Status: DC

## 2020-08-22 MED ORDER — ALPRAZOLAM 0.5 MG PO TABS: 0.5000 mg | ORAL_TABLET | Freq: Three times a day (TID) | ORAL | 1 refills | Status: DC | PRN

## 2020-08-22 NOTE — Progress Notes (Signed)
Virtual Visit via Video Note  I connected with Angela Townsend on 08/22/20 at  3:30 PM EDT by a video enabled telemedicine application and verified that I am speaking with the correct person using two identifiers.  Location patient: home Location provider: work office Persons participating in the virtual visit: patient, provider  I discussed the limitations of evaluation and management by telemedicine and the availability of in person appointments. The patient expressed understanding and agreed to proceed.   HPI: This is a scheduled visit per protocol for her Adderall refills.  She has been on 20 mg of Adderall daily for some time now.  She is tolerating medication well, has had no side effects.  Since I last spoke with her she has had both of her Covid vaccinations.  She has otherwise been doing well.  She is also requesting refills for her Lipitor, Zoloft and Xanax.   ROS: Constitutional: Denies fever, chills, diaphoresis, appetite change and fatigue.  HEENT: Denies photophobia, eye pain, redness, hearing loss, ear pain, congestion, sore throat, rhinorrhea, sneezing, mouth sores, trouble swallowing, neck pain, neck stiffness and tinnitus.   Respiratory: Denies SOB, DOE, cough, chest tightness,  and wheezing.   Cardiovascular: Denies chest pain, palpitations and leg swelling.  Gastrointestinal: Denies nausea, vomiting, abdominal pain, diarrhea, constipation, blood in stool and abdominal distention.  Genitourinary: Denies dysuria, urgency, frequency, hematuria, flank pain and difficulty urinating.  Endocrine: Denies: hot or cold intolerance, sweats, changes in hair or nails, polyuria, polydipsia. Musculoskeletal: Denies myalgias, back pain, joint swelling, arthralgias and gait problem.  Skin: Denies pallor, rash and wound.  Neurological: Denies dizziness, seizures, syncope, weakness, light-headedness, numbness and headaches.  Hematological: Denies adenopathy. Easy bruising,  personal or family bleeding history  Psychiatric/Behavioral: Denies suicidal ideation, mood changes, confusion, nervousness, sleep disturbance and agitation   Past Medical History:  Diagnosis Date  . ADD 01/02/2009  . Headache(784.0) 01/02/2009  . HYPERLIPIDEMIA 01/02/2009  . Obesity   . TOBACCO ABUSE 01/02/2009    Past Surgical History:  Procedure Laterality Date  . WISDOM TOOTH EXTRACTION      Family History  Problem Relation Age of Onset  . Diabetes Mother   . Hyperlipidemia Mother   . Birth defects Maternal Grandmother        breast, colon, brain ca    SOCIAL HX:   reports that she has been smoking cigarettes. She has been smoking about 0.50 packs per day. She has never used smokeless tobacco. She reports current alcohol use. She reports that she does not use drugs.   Current Outpatient Medications:  .  ALPRAZolam (XANAX) 0.5 MG tablet, Take 1 tablet (0.5 mg total) by mouth 3 (three) times daily as needed for anxiety (panic attacks)., Disp: 30 tablet, Rfl: 1 .  amphetamine-dextroamphetamine (ADDERALL XR) 20 MG 24 hr capsule, Take 1 capsule (20 mg total) by mouth every morning., Disp: 30 capsule, Rfl: 0 .  amphetamine-dextroamphetamine (ADDERALL XR) 20 MG 24 hr capsule, Take 1 capsule (20 mg total) by mouth every morning., Disp: 30 capsule, Rfl: 0 .  amphetamine-dextroamphetamine (ADDERALL XR) 20 MG 24 hr capsule, Take 1 capsule (20 mg total) by mouth every morning., Disp: 30 capsule, Rfl: 0 .  atorvastatin (LIPITOR) 40 MG tablet, Take 1 tablet (40 mg total) by mouth daily at 6 PM., Disp: 90 tablet, Rfl: 1 .  Melatonin 5 MG TABS, Take 5 mg by mouth at bedtime as needed (sleep)., Disp: , Rfl:  .  sertraline (ZOLOFT) 50 MG tablet, Take  1 tablet (50 mg total) by mouth daily., Disp: 90 tablet, Rfl: 1  EXAM:   VITALS per patient if applicable: None reported  GENERAL: alert, oriented, appears well and in no acute distress  HEENT: atraumatic, conjunttiva clear, no obvious  abnormalities on inspection of external nose and ears  NECK: normal movements of the head and neck  LUNGS: on inspection no signs of respiratory distress, breathing rate appears normal, no obvious gross increased work of breathing, gasping or wheezing  CV: no obvious cyanosis  MS: moves all visible extremities without noticeable abnormality  PSYCH/NEURO: pleasant and cooperative, no obvious depression or anxiety, speech and thought processing grossly intact  ASSESSMENT AND PLAN:   Anxiety state -She is requesting refills of her Zoloft and Ativan today.  Attention deficit hyperactivity disorder (ADHD), unspecified ADHD type  -PDMP reviewed, no red flags, overdose risk score is 10. -Refill Adderall 20 mg daily x30 days x3 months.  Dyslipidemia  - Plan: atorvastatin (LIPITOR) 40 MG tablet     I discussed the assessment and treatment plan with the patient. The patient was provided an opportunity to ask questions and all were answered. The patient agreed with the plan and demonstrated an understanding of the instructions.   The patient was advised to call back or seek an in-person evaluation if the symptoms worsen or if the condition fails to improve as anticipated.    Chaya Jan, MD   [facility] at [facility]

## 2020-10-27 ENCOUNTER — Ambulatory Visit (INDEPENDENT_AMBULATORY_CARE_PROVIDER_SITE_OTHER): Payer: 59 | Admitting: Internal Medicine

## 2020-10-27 ENCOUNTER — Other Ambulatory Visit: Payer: Self-pay

## 2020-10-27 VITALS — BP 124/76 | HR 112 | Temp 97.7°F | Ht 66.0 in | Wt 292.8 lb

## 2020-10-27 DIAGNOSIS — E538 Deficiency of other specified B group vitamins: Secondary | ICD-10-CM | POA: Diagnosis not present

## 2020-10-27 DIAGNOSIS — R7302 Impaired glucose tolerance (oral): Secondary | ICD-10-CM

## 2020-10-27 DIAGNOSIS — E785 Hyperlipidemia, unspecified: Secondary | ICD-10-CM | POA: Diagnosis not present

## 2020-10-27 DIAGNOSIS — E559 Vitamin D deficiency, unspecified: Secondary | ICD-10-CM

## 2020-10-27 DIAGNOSIS — F909 Attention-deficit hyperactivity disorder, unspecified type: Secondary | ICD-10-CM

## 2020-10-27 DIAGNOSIS — Z0000 Encounter for general adult medical examination without abnormal findings: Secondary | ICD-10-CM

## 2020-10-27 MED ORDER — ATORVASTATIN CALCIUM 40 MG PO TABS: 40.0000 mg | ORAL_TABLET | Freq: Every day | ORAL | 1 refills | Status: DC

## 2020-10-27 NOTE — Progress Notes (Signed)
Established Patient Office Visit     This visit occurred during the SARS-CoV-2 public health emergency.  Safety protocols were in place, including screening questions prior to the visit, additional usage of staff PPE, and extensive cleaning of exam room while observing appropriate contact time as indicated for disinfecting solutions.    CC/Reason for Visit: Annual preventive exam  HPI: Angela Townsend is a 44 y.o. female who is coming in today for the above mentioned reasons. Past Medical History is significant for:  ADHD on adderall (not due for refills today), GAD, HLD, obesity.  She has no acute issues today.  She has routine eye and dental care.  She will be due for her Covid booster in January.  She is also due for flu and Tdap vaccines but is declining today.  She saw her GYN earlier this week and had a Pap smear and a mammogram.   Past Medical/Surgical History: Past Medical History:  Diagnosis Date  . ADD 01/02/2009  . Headache(784.0) 01/02/2009  . HYPERLIPIDEMIA 01/02/2009  . Obesity   . TOBACCO ABUSE 01/02/2009    Past Surgical History:  Procedure Laterality Date  . WISDOM TOOTH EXTRACTION      Social History:  reports that she has been smoking cigarettes. She has been smoking about 0.50 packs per day. She has never used smokeless tobacco. She reports current alcohol use. She reports that she does not use drugs.  Allergies: No Known Allergies  Family History:  Family History  Problem Relation Age of Onset  . Diabetes Mother   . Hyperlipidemia Mother   . Birth defects Maternal Grandmother        breast, colon, brain ca     Current Outpatient Medications:  .  ALPRAZolam (XANAX) 0.5 MG tablet, Take 1 tablet (0.5 mg total) by mouth 3 (three) times daily as needed for anxiety (panic attacks)., Disp: 30 tablet, Rfl: 1 .  amphetamine-dextroamphetamine (ADDERALL XR) 20 MG 24 hr capsule, Take 1 capsule (20 mg total) by mouth every morning., Disp: 30 capsule,  Rfl: 0 .  amphetamine-dextroamphetamine (ADDERALL XR) 20 MG 24 hr capsule, Take 1 capsule (20 mg total) by mouth every morning., Disp: 30 capsule, Rfl: 0 .  amphetamine-dextroamphetamine (ADDERALL XR) 20 MG 24 hr capsule, Take 1 capsule (20 mg total) by mouth every morning., Disp: 30 capsule, Rfl: 0 .  Melatonin 5 MG TABS, Take 5 mg by mouth at bedtime as needed (sleep)., Disp: , Rfl:  .  sertraline (ZOLOFT) 50 MG tablet, Take 1 tablet (50 mg total) by mouth daily., Disp: 90 tablet, Rfl: 1 .  atorvastatin (LIPITOR) 40 MG tablet, Take 1 tablet (40 mg total) by mouth daily at 6 PM., Disp: 90 tablet, Rfl: 1  Review of Systems:  Constitutional: Denies fever, chills, diaphoresis, appetite change and fatigue.  HEENT: Denies photophobia, eye pain, redness, hearing loss, ear pain, congestion, sore throat, rhinorrhea, sneezing, mouth sores, trouble swallowing, neck pain, neck stiffness and tinnitus.   Respiratory: Denies SOB, DOE, cough, chest tightness,  and wheezing.   Cardiovascular: Denies chest pain, palpitations and leg swelling.  Gastrointestinal: Denies nausea, vomiting, abdominal pain, diarrhea, constipation, blood in stool and abdominal distention.  Genitourinary: Denies dysuria, urgency, frequency, hematuria, flank pain and difficulty urinating.  Endocrine: Denies: hot or cold intolerance, sweats, changes in hair or nails, polyuria, polydipsia. Musculoskeletal: Denies myalgias, back pain, joint swelling, arthralgias and gait problem.  Skin: Denies pallor, rash and wound.  Neurological: Denies dizziness, seizures, syncope, weakness, light-headedness,  numbness and headaches.  Hematological: Denies adenopathy. Easy bruising, personal or family bleeding history  Psychiatric/Behavioral: Denies suicidal ideation, mood changes, confusion, nervousness, sleep disturbance and agitation    Physical Exam: Vitals:   10/27/20 0808  BP: 124/76  Pulse: (!) 112  Temp: 97.7 F (36.5 C)  TempSrc: Oral   SpO2: 96%  Weight: 292 lb 12.8 oz (132.8 kg)  Height: 5' 6" (1.676 m)    Body mass index is 47.26 kg/m.   Constitutional: NAD, calm, comfortable Eyes: PERRL, lids and conjunctivae normal ENMT: Mucous membranes are moist. Posterior pharynx clear of any exudate or lesions. Normal dentition. Tympanic membrane is pearly white, no erythema or bulging. Neck: normal, supple, no masses, no thyromegaly Respiratory: clear to auscultation bilaterally, no wheezing, no crackles. Normal respiratory effort. No accessory muscle use.  Cardiovascular: Regular rate and rhythm, no murmurs / rubs / gallops. No extremity edema. 2+ pedal pulses.  Abdomen: no tenderness, no masses palpated. No hepatosplenomegaly. Bowel sounds positive.  Musculoskeletal: no clubbing / cyanosis. No joint deformity upper and lower extremities. Good ROM, no contractures. Normal muscle tone.  Skin: no rashes, lesions, ulcers. No induration Neurologic: CN 2-12 grossly intact. Sensation intact, DTR normal. Strength 5/5 in all 4.  Psychiatric: Normal judgment and insight. Alert and oriented x 3. Normal mood.    Impression and Plan:  Encounter for preventive health examination  -She has routine eye and dental care. -Advised flu and Tdap vaccines today, she declines.  She will be eligible for her Covid booster in January. -Screening labs today. -Healthy lifestyle discussed in detail. -Commence routine colon cancer screening age 44. -She had mammogram and Pap smear done at her [place] office earlier this week.  Dyslipidemia  - Plan: atorvastatin (LIPITOR) 40 MG tablet -Last LDL was 146 in August 2020. -Check lipids today.  Vitamin B12 deficiency  - Plan: Vitamin B12  Vitamin D deficiency  - Plan: VITAMIN D 25 Hydroxy (Vit-D Deficiency, Fractures)  IGT (impaired glucose tolerance)  -Check A1c today, was last 6.3 in August 2020.  Attention deficit hyperactivity disorder (ADHD), unspecified ADHD type -Not due for Adderall  refills today.  Morbid obesity ([place]) -Discussed healthy lifestyle, including increased physical activity and better food choices to promote weight loss.    Patient Instructions  -Nice seeing you today!!  -Lab work today; will notify you once results are available.  -Consider COVID booster (after January), flu and tetanus vaccines.  -Schedule follow up in 6 months.   Preventive Care 58-45 Years Old, Female Preventive care refers to visits with your health care provider and lifestyle choices that can promote health and wellness. This includes:  A yearly physical exam. This may also be called an annual well check.  Regular dental visits and eye exams.  Immunizations.  Screening for certain conditions.  Healthy lifestyle choices, such as eating a healthy diet, getting regular exercise, not using drugs or products that contain nicotine and tobacco, and limiting alcohol use. What can I expect for my preventive care visit? Physical exam Your health care provider will check your:  Height and weight. This may be used to calculate body mass index (BMI), which tells if you are at a healthy weight.  Heart rate and blood pressure.  Skin for abnormal spots. Counseling Your health care provider may ask you questions about your:  Alcohol, tobacco, and drug use.  Emotional well-being.  Home and relationship well-being.  Sexual activity.  Eating habits.  Work and work Statistician.  Method of birth control.  Menstrual cycle.  Pregnancy history. What immunizations do I need?  Influenza (flu) vaccine  This is recommended every year. Tetanus, diphtheria, and pertussis (Tdap) vaccine  You may need a Td booster every 10 years. Varicella (chickenpox) vaccine  You may need this if you have not been vaccinated. Human papillomavirus (HPV) vaccine  If recommended by your health care provider, you may need three doses over 6 months. Measles, mumps, and rubella (MMR)  vaccine  You may need at least one dose of MMR. You may also need a second dose. Meningococcal conjugate (MenACWY) vaccine  One dose is recommended if you are age 33-21 years and a first-year college student living in a residence hall, or if you have one of several medical conditions. You may also need additional booster doses. Pneumococcal conjugate (PCV13) vaccine  You may need this if you have certain conditions and were not previously vaccinated. Pneumococcal polysaccharide (PPSV23) vaccine  You may need one or two doses if you smoke cigarettes or if you have certain conditions. Hepatitis A vaccine  You may need this if you have certain conditions or if you travel or work in places where you may be exposed to hepatitis A. Hepatitis B vaccine  You may need this if you have certain conditions or if you travel or work in places where you may be exposed to hepatitis B. Haemophilus influenzae type b (Hib) vaccine  You may need this if you have certain conditions. You may receive vaccines as individual doses or as more than one vaccine together in one shot (combination vaccines). Talk with your health care provider about the risks and benefits of combination vaccines. What tests do I need?  Blood tests  Lipid and cholesterol levels. These may be checked every 5 years starting at age 47.  Hepatitis C test.  Hepatitis B test. Screening  Diabetes screening. This is done by checking your blood sugar (glucose) after you have not eaten for a while (fasting).  Sexually transmitted disease (STD) testing.  BRCA-related cancer screening. This may be done if you have a family history of breast, ovarian, tubal, or peritoneal cancers.  Pelvic exam and Pap test. This may be done every 3 years starting at age 52. Starting at age 24, this may be done every 5 years if you have a Pap test in combination with an HPV test. Talk with your health care provider about your test results, treatment  options, and if necessary, the need for more tests. Follow these instructions at home: Eating and drinking   Eat a diet that includes fresh fruits and vegetables, whole grains, lean protein, and low-fat dairy.  Take vitamin and mineral supplements as recommended by your health care provider.  Do not drink alcohol if: ? Your health care provider tells you not to drink. ? You are pregnant, may be pregnant, or are planning to become pregnant.  If you drink alcohol: ? Limit how much you have to 0-1 drink a day. ? Be aware of how much alcohol is in your drink. In the [place] one drink equals one 12 oz bottle of beer (355 mL), one 5 oz glass of wine (148 mL), or one 1 oz glass of hard liquor (44 mL). Lifestyle  Take daily care of your teeth and gums.  Stay active. Exercise for at least 30 minutes on 5 or more days each week.  Do not use any products that contain nicotine or tobacco, such as cigarettes, e-cigarettes, and chewing tobacco. If you need help  quitting, ask your health care provider.  If you are sexually active, practice safe sex. Use a condom or other form of birth control (contraception) in order to prevent pregnancy and STIs (sexually transmitted infections). If you plan to become pregnant, see your health care provider for a preconception visit. What's next?  Visit your health care provider once a year for a well check visit.  Ask your health care provider how often you should have your eyes and teeth checked.  Stay up to date on all vaccines. This information is not intended to replace advice given to you by your health care provider. Make sure you discuss any questions you have with your health care provider. Document Revised: 07/16/2018 Document Reviewed: 07/16/2018 Elsevier Patient Education  2020 [place], [place] [facility]

## 2020-10-27 NOTE — Patient Instructions (Signed)
-Nice seeing you today!!  -Lab work today; will notify you once results are available.  -Consider COVID booster (after January), flu and tetanus vaccines.  -Schedule follow up in 6 months.   Preventive Care 95-42 Years Old, Female Preventive care refers to visits with your health care provider and lifestyle choices that can promote health and wellness. This includes:  A yearly physical exam. This may also be called an annual well check.  Regular dental visits and eye exams.  Immunizations.  Screening for certain conditions.  Healthy lifestyle choices, such as eating a healthy diet, getting regular exercise, not using drugs or products that contain nicotine and tobacco, and limiting alcohol use. What can I expect for my preventive care visit? Physical exam Your health care provider will check your:  Height and weight. This may be used to calculate body mass index (BMI), which tells if you are at a healthy weight.  Heart rate and blood pressure.  Skin for abnormal spots. Counseling Your health care provider may ask you questions about your:  Alcohol, tobacco, and drug use.  Emotional well-being.  Home and relationship well-being.  Sexual activity.  Eating habits.  Work and work Statistician.  Method of birth control.  Menstrual cycle.  Pregnancy history. What immunizations do I need?  Influenza (flu) vaccine  This is recommended every year. Tetanus, diphtheria, and pertussis (Tdap) vaccine  You may need a Td booster every 10 years. Varicella (chickenpox) vaccine  You may need this if you have not been vaccinated. Human papillomavirus (HPV) vaccine  If recommended by your health care provider, you may need three doses over 6 months. Measles, mumps, and rubella (MMR) vaccine  You may need at least one dose of MMR. You may also need a second dose. Meningococcal conjugate (MenACWY) vaccine  One dose is recommended if you are age 77-21 years and a  first-year college student living in a residence hall, or if you have one of several medical conditions. You may also need additional booster doses. Pneumococcal conjugate (PCV13) vaccine  You may need this if you have certain conditions and were not previously vaccinated. Pneumococcal polysaccharide (PPSV23) vaccine  You may need one or two doses if you smoke cigarettes or if you have certain conditions. Hepatitis A vaccine  You may need this if you have certain conditions or if you travel or work in places where you may be exposed to hepatitis A. Hepatitis B vaccine  You may need this if you have certain conditions or if you travel or work in places where you may be exposed to hepatitis B. Haemophilus influenzae type b (Hib) vaccine  You may need this if you have certain conditions. You may receive vaccines as individual doses or as more than one vaccine together in one shot (combination vaccines). Talk with your health care provider about the risks and benefits of combination vaccines. What tests do I need?  Blood tests  Lipid and cholesterol levels. These may be checked every 5 years starting at age 50.  Hepatitis C test.  Hepatitis B test. Screening  Diabetes screening. This is done by checking your blood sugar (glucose) after you have not eaten for a while (fasting).  Sexually transmitted disease (STD) testing.  BRCA-related cancer screening. This may be done if you have a family history of breast, ovarian, tubal, or peritoneal cancers.  Pelvic exam and Pap test. This may be done every 3 years starting at age 35. Starting at age 43, this may be done every  5 years if you have a Pap test in combination with an HPV test. Talk with your health care provider about your test results, treatment options, and if necessary, the need for more tests. Follow these instructions at home: Eating and drinking   Eat a diet that includes fresh fruits and vegetables, whole grains, lean  protein, and low-fat dairy.  Take vitamin and mineral supplements as recommended by your health care provider.  Do not drink alcohol if: ? Your health care provider tells you not to drink. ? You are pregnant, may be pregnant, or are planning to become pregnant.  If you drink alcohol: ? Limit how much you have to 0-1 drink a day. ? Be aware of how much alcohol is in your drink. In the [place] one drink equals one 12 oz bottle of beer (355 mL), one 5 oz glass of wine (148 mL), or one 1 oz glass of hard liquor (44 mL). Lifestyle  Take daily care of your teeth and gums.  Stay active. Exercise for at least 30 minutes on 5 or more days each week.  Do not use any products that contain nicotine or tobacco, such as cigarettes, e-cigarettes, and chewing tobacco. If you need help quitting, ask your health care provider.  If you are sexually active, practice safe sex. Use a condom or other form of birth control (contraception) in order to prevent pregnancy and STIs (sexually transmitted infections). If you plan to become pregnant, see your health care provider for a preconception visit. What's next?  Visit your health care provider once a year for a well check visit.  Ask your health care provider how often you should have your eyes and teeth checked.  Stay up to date on all vaccines. This information is not intended to replace advice given to you by your health care provider. Make sure you discuss any questions you have with your health care provider. Document Revised: 07/16/2018 Document Reviewed: 07/16/2018 Elsevier Patient Education  2020 [employer].

## 2020-10-27 NOTE — Addendum Note (Signed)
Addended by: Lerry Liner on: 10/27/2020 08:39 AM   Modules accepted: Orders

## 2020-10-28 LAB — CBC WITH DIFFERENTIAL/PLATELET
Absolute Monocytes: 284 cells/uL (ref 200–950)
Basophils Absolute: 32 cells/uL (ref 0–200)
Basophils Relative: 0.5 %
Eosinophils Absolute: 239 cells/uL (ref 15–500)
Eosinophils Relative: 3.8 %
HCT: 41 % (ref 35.0–45.0)
Hemoglobin: 13.8 g/dL (ref 11.7–15.5)
Lymphs Abs: 1802 cells/uL (ref 850–3900)
MCH: 29.5 pg (ref 27.0–33.0)
MCHC: 33.7 g/dL (ref 32.0–36.0)
MCV: 87.6 fL (ref 80.0–100.0)
MPV: 11.4 fL (ref 7.5–12.5)
Monocytes Relative: 4.5 %
Neutro Abs: 3944 cells/uL (ref 1500–7800)
Neutrophils Relative %: 62.6 %
Platelets: 247 10*3/uL (ref 140–400)
RBC: 4.68 10*6/uL (ref 3.80–5.10)
RDW: 13.3 % (ref 11.0–15.0)
Total Lymphocyte: 28.6 %
WBC: 6.3 10*3/uL (ref 3.8–10.8)

## 2020-10-28 LAB — HEMOGLOBIN A1C
Hgb A1c MFr Bld: 6.5 % of total Hgb — ABNORMAL HIGH (ref <5.7)
Mean Plasma Glucose: 140 mg/dL
eAG (mmol/L): 7.7 mmol/L

## 2020-10-28 LAB — LIPID PANEL
Cholesterol: 221 mg/dL — ABNORMAL HIGH (ref <200)
HDL: 39 mg/dL — ABNORMAL LOW (ref >=50)
LDL Cholesterol (Calc): 154 mg/dL (calc) — ABNORMAL HIGH
Non-HDL Cholesterol (Calc): 182 mg/dL (calc) — ABNORMAL HIGH (ref <130)
Total CHOL/HDL Ratio: 5.7 (calc) — ABNORMAL HIGH (ref <5.0)
Triglycerides: 151 mg/dL — ABNORMAL HIGH (ref <150)

## 2020-10-28 LAB — COMPREHENSIVE METABOLIC PANEL
AG Ratio: 1.5 (calc) (ref 1.0–2.5)
ALT: 19 U/L (ref 6–29)
AST: 16 U/L (ref 10–30)
Albumin: 4.1 g/dL (ref 3.6–5.1)
Alkaline phosphatase (APISO): 70 U/L (ref 31–125)
BUN: 10 mg/dL (ref 7–25)
CO2: 24 mmol/L (ref 20–32)
Calcium: 9.1 mg/dL (ref 8.6–10.2)
Chloride: 105 mmol/L (ref 98–110)
Creat: 0.85 mg/dL (ref 0.50–1.10)
Globulin: 2.7 g/dL (calc) (ref 1.9–3.7)
Glucose, Bld: 135 mg/dL — ABNORMAL HIGH (ref 65–99)
Potassium: 4 mmol/L (ref 3.5–5.3)
Sodium: 138 mmol/L (ref 135–146)
Total Bilirubin: 1.2 mg/dL (ref 0.2–1.2)
Total Protein: 6.8 g/dL (ref 6.1–8.1)

## 2020-10-28 LAB — VITAMIN D 25 HYDROXY (VIT D DEFICIENCY, FRACTURES): Vit D, 25-Hydroxy: 18 ng/mL — ABNORMAL LOW (ref 30–100)

## 2020-10-28 LAB — HM PAP SMEAR

## 2020-10-28 LAB — TSH: TSH: 1.39 mIU/L

## 2020-10-28 LAB — VITAMIN B12: Vitamin B-12: 295 pg/mL (ref 200–1100)

## 2020-10-30 ENCOUNTER — Encounter: Payer: Self-pay | Admitting: Internal Medicine

## 2020-11-01 ENCOUNTER — Other Ambulatory Visit: Payer: Self-pay | Admitting: Internal Medicine

## 2020-11-01 ENCOUNTER — Encounter: Payer: Self-pay | Admitting: Internal Medicine

## 2020-11-01 DIAGNOSIS — E785 Hyperlipidemia, unspecified: Secondary | ICD-10-CM

## 2020-11-01 MED ORDER — VITAMIN D (ERGOCALCIFEROL) 1.25 MG (50000 UNIT) PO CAPS: 50000.0000 [IU] | ORAL_CAPSULE | ORAL | 0 refills | Status: AC

## 2020-11-01 MED ORDER — ATORVASTATIN CALCIUM 80 MG PO TABS: 80.0000 mg | ORAL_TABLET | Freq: Every day | ORAL | 1 refills | Status: DC

## 2020-11-01 MED ORDER — METFORMIN HCL 500 MG PO TABS
500.0000 mg | ORAL_TABLET | Freq: Two times a day (BID) | ORAL | 3 refills | Status: DC
Start: 2020-11-01 — End: 2021-05-09

## 2020-11-09 ENCOUNTER — Other Ambulatory Visit: Payer: Self-pay | Admitting: Gynecology

## 2020-11-09 DIAGNOSIS — R928 Other abnormal and inconclusive findings on diagnostic imaging of breast: Secondary | ICD-10-CM

## 2020-11-13 ENCOUNTER — Other Ambulatory Visit: Payer: Self-pay | Admitting: Internal Medicine

## 2020-11-13 DIAGNOSIS — F909 Attention-deficit hyperactivity disorder, unspecified type: Secondary | ICD-10-CM

## 2020-11-13 NOTE — Telephone Encounter (Signed)
Pt is calling in stating that she needs Rx amphetamine-dextroamphetamine (ADDERALL XR) 20 MG   Pharm:  [facility] on [address]

## 2020-11-14 ENCOUNTER — Other Ambulatory Visit: Payer: Self-pay | Admitting: Internal Medicine

## 2020-11-14 DIAGNOSIS — F909 Attention-deficit hyperactivity disorder, unspecified type: Secondary | ICD-10-CM

## 2020-11-14 NOTE — Telephone Encounter (Signed)
Patient had an office visit 10/27/20

## 2020-11-15 MED ORDER — AMPHETAMINE-DEXTROAMPHET ER 20 MG PO CP24: 20.0000 mg | ORAL_CAPSULE | ORAL | 0 refills | Status: DC

## 2020-11-15 NOTE — Telephone Encounter (Signed)
Please deny.  This has been sent to the pharmacy 11/15/20.

## 2020-11-29 ENCOUNTER — Other Ambulatory Visit: Payer: Self-pay | Admitting: Gynecology

## 2020-11-29 ENCOUNTER — Other Ambulatory Visit: Payer: Self-pay

## 2020-11-29 ENCOUNTER — Ambulatory Visit
Admission: RE | Admit: 2020-11-29 | Discharge: 2020-11-29 | Disposition: A | Discharge status: Home / Self Care | Payer: 59 | Source: Ambulatory Visit | Attending: Gynecology | Admitting: Gynecology

## 2020-11-29 DIAGNOSIS — R928 Other abnormal and inconclusive findings on diagnostic imaging of breast: Secondary | ICD-10-CM

## 2020-11-29 DIAGNOSIS — R921 Mammographic calcification found on diagnostic imaging of breast: Secondary | ICD-10-CM

## 2020-12-21 ENCOUNTER — Encounter: Payer: Self-pay | Admitting: Internal Medicine

## 2020-12-29 ENCOUNTER — Other Ambulatory Visit: Payer: Self-pay | Admitting: Internal Medicine

## 2020-12-29 DIAGNOSIS — F909 Attention-deficit hyperactivity disorder, unspecified type: Secondary | ICD-10-CM

## 2020-12-29 NOTE — Telephone Encounter (Signed)
Please deny.  There should be refills on file.

## 2021-02-05 ENCOUNTER — Other Ambulatory Visit: Payer: Self-pay | Admitting: Internal Medicine

## 2021-02-05 DIAGNOSIS — F909 Attention-deficit hyperactivity disorder, unspecified type: Secondary | ICD-10-CM

## 2021-02-06 NOTE — Telephone Encounter (Signed)
Please deny.  I have spoken with the pharmacist and this will be filled today.

## 2021-02-06 NOTE — Telephone Encounter (Signed)
Is it time for a a visit?

## 2021-03-25 ENCOUNTER — Other Ambulatory Visit: Payer: Self-pay | Admitting: Internal Medicine

## 2021-03-25 DIAGNOSIS — F909 Attention-deficit hyperactivity disorder, unspecified type: Secondary | ICD-10-CM

## 2021-03-27 NOTE — Telephone Encounter (Signed)
Last office visit 10/27/20 Last refill 11/15/20  Please deny

## 2021-03-28 ENCOUNTER — Other Ambulatory Visit: Payer: Self-pay | Admitting: Internal Medicine

## 2021-03-28 DIAGNOSIS — F909 Attention-deficit hyperactivity disorder, unspecified type: Secondary | ICD-10-CM

## 2021-03-29 NOTE — Telephone Encounter (Signed)
Please deny. 

## 2021-04-09 ENCOUNTER — Other Ambulatory Visit: Payer: Self-pay | Admitting: Internal Medicine

## 2021-04-09 DIAGNOSIS — F909 Attention-deficit hyperactivity disorder, unspecified type: Secondary | ICD-10-CM

## 2021-04-09 NOTE — Telephone Encounter (Signed)
Patient is calling and is requesting a refill for amphetamine-dextroamphetamine (ADDERALL XR) 20 MG 24 hr capsule to be sent to   [id number] [place], [place] - [address]  [address], [place] [address]  Phone:  [phone] Fax:  [phone] CB is [phone]

## 2021-04-10 MED ORDER — AMPHETAMINE-DEXTROAMPHET ER 20 MG PO CP24: 20.0000 mg | ORAL_CAPSULE | ORAL | 0 refills | Status: DC

## 2021-04-10 NOTE — Telephone Encounter (Signed)
Patient has schedule an appointment 04/20/21.  Okay to send in refills until her appointment?

## 2021-04-10 NOTE — Telephone Encounter (Signed)
Rx sent 

## 2021-04-20 ENCOUNTER — Encounter: Payer: Self-pay | Admitting: Internal Medicine

## 2021-04-20 ENCOUNTER — Telehealth (INDEPENDENT_AMBULATORY_CARE_PROVIDER_SITE_OTHER): Payer: 59 | Admitting: Internal Medicine

## 2021-04-20 VITALS — Wt 270.0 lb

## 2021-04-20 DIAGNOSIS — E119 Type 2 diabetes mellitus without complications: Secondary | ICD-10-CM | POA: Diagnosis not present

## 2021-04-20 DIAGNOSIS — F411 Generalized anxiety disorder: Secondary | ICD-10-CM | POA: Diagnosis not present

## 2021-04-20 DIAGNOSIS — E785 Hyperlipidemia, unspecified: Secondary | ICD-10-CM | POA: Diagnosis not present

## 2021-04-20 DIAGNOSIS — E559 Vitamin D deficiency, unspecified: Secondary | ICD-10-CM

## 2021-04-20 DIAGNOSIS — F909 Attention-deficit hyperactivity disorder, unspecified type: Secondary | ICD-10-CM

## 2021-04-20 DIAGNOSIS — E538 Deficiency of other specified B group vitamins: Secondary | ICD-10-CM

## 2021-04-20 MED ORDER — AMPHETAMINE-DEXTROAMPHET ER 20 MG PO CP24: 20.0000 mg | ORAL_CAPSULE | ORAL | 0 refills | Status: DC

## 2021-04-20 MED ORDER — ATORVASTATIN CALCIUM 80 MG PO TABS: 80.0000 mg | ORAL_TABLET | Freq: Every day | ORAL | 0 refills | Status: DC

## 2021-04-20 MED ORDER — ALPRAZOLAM 0.5 MG PO TABS: 0.5000 mg | ORAL_TABLET | Freq: Three times a day (TID) | ORAL | 1 refills | Status: DC | PRN

## 2021-04-20 NOTE — Progress Notes (Signed)
Virtual Visit via Video Note  I connected with Angela Townsend on 04/20/21 at  3:00 PM EDT by a video enabled telemedicine application and verified that I am speaking with the correct person using two identifiers.  Location patient: home Location provider: work office Persons participating in the virtual visit: patient, provider  I discussed the limitations of evaluation and management by telemedicine and the availability of in person appointments. The patient expressed understanding and agreed to proceed.   HPI: She has scheduled this visit to follow-up on chronic medical conditions and for medication refills.  She has ADHD and is on Adderall XR 20 mg daily.  She is also requesting refills of her Xanax 0.5 mg that she takes as needed.  She was recently diagnosed with diabetes with an A1c of 6.5.  She was advised metformin which she discontinued after 5 days due to diarrhea.  Her Lipitor was increased to 80 mg which she has been taking without issue.  She has lots of questions about nutrition and diet.   ROS: Constitutional: Denies fever, chills, diaphoresis, appetite change and fatigue.  HEENT: Denies photophobia, eye pain, redness, hearing loss, ear pain, congestion, sore throat, rhinorrhea, sneezing, mouth sores, trouble swallowing, neck pain, neck stiffness and tinnitus.   Respiratory: Denies SOB, DOE, cough, chest tightness,  and wheezing.   Cardiovascular: Denies chest pain, palpitations and leg swelling.  Gastrointestinal: Denies nausea, vomiting, abdominal pain, diarrhea, constipation, blood in stool and abdominal distention.  Genitourinary: Denies dysuria, urgency, frequency, hematuria, flank pain and difficulty urinating.  Endocrine: Denies: hot or cold intolerance, sweats, changes in hair or nails, polyuria, polydipsia. Musculoskeletal: Denies myalgias, back pain, joint swelling, arthralgias and gait problem.  Skin: Denies pallor, rash and wound.  Neurological: Denies  dizziness, seizures, syncope, weakness, light-headedness, numbness and headaches.  Hematological: Denies adenopathy. Easy bruising, personal or family bleeding history  Psychiatric/Behavioral: Denies suicidal ideation, mood changes, confusion, nervousness, sleep disturbance and agitation   Past Medical History:  Diagnosis Date  . ADD 01/02/2009  . Headache(784.0) 01/02/2009  . HYPERLIPIDEMIA 01/02/2009  . Obesity   . TOBACCO ABUSE 01/02/2009    Past Surgical History:  Procedure Laterality Date  . WISDOM TOOTH EXTRACTION      Family History  Problem Relation Age of Onset  . Diabetes Mother   . Hyperlipidemia Mother   . Birth defects Maternal Grandmother        breast, colon, brain ca    SOCIAL HX:   reports that she has been smoking cigarettes. She has been smoking about 0.50 packs per day. She has never used smokeless tobacco. She reports current alcohol use. She reports that she does not use drugs.   Current Outpatient Medications:  Marland Kitchen  Melatonin 5 MG TABS, Take 5 mg by mouth at bedtime as needed (sleep)., Disp: , Rfl:  .  sertraline (ZOLOFT) 50 MG tablet, Take 1 tablet (50 mg total) by mouth daily., Disp: 90 tablet, Rfl: 1 .  ALPRAZolam (XANAX) 0.5 MG tablet, Take 1 tablet (0.5 mg total) by mouth 3 (three) times daily as needed for anxiety (panic attacks)., Disp: 30 tablet, Rfl: 1 .  amphetamine-dextroamphetamine (ADDERALL XR) 20 MG 24 hr capsule, Take 1 capsule (20 mg total) by mouth every morning., Disp: 30 capsule, Rfl: 0 .  amphetamine-dextroamphetamine (ADDERALL XR) 20 MG 24 hr capsule, Take 1 capsule (20 mg total) by mouth every morning., Disp: 30 capsule, Rfl: 0 .  amphetamine-dextroamphetamine (ADDERALL XR) 20 MG 24 hr capsule, Take 1  capsule (20 mg total) by mouth every morning., Disp: 30 capsule, Rfl: 0 .  atorvastatin (LIPITOR) 80 MG tablet, Take 1 tablet (80 mg total) by mouth daily at 6 PM., Disp: 90 tablet, Rfl: 0 .  metFORMIN (GLUCOPHAGE) 500 MG tablet, Take 1  tablet (500 mg total) by mouth 2 (two) times daily with a meal. (Patient not taking: Reported on 04/20/2021), Disp: 180 tablet, Rfl: 3  EXAM:   VITALS per patient if applicable: None reported  GENERAL: alert, oriented, appears well and in no acute distress  HEENT: atraumatic, conjunttiva clear, no obvious abnormalities on inspection of external nose and ears  NECK: normal movements of the head and neck  LUNGS: on inspection no signs of respiratory distress, breathing rate appears normal, no obvious gross increased work of breathing, gasping or wheezing  CV: no obvious cyanosis  MS: moves all visible extremities without noticeable abnormality  PSYCH/NEURO: pleasant and cooperative, no obvious depression or anxiety, speech and thought processing grossly intact  ASSESSMENT AND PLAN:   Type 2 diabetes mellitus without complication, without long-term current use of insulin (HCC) -This is a new diagnosis as of December 2021.  She has not been back in office since. -She discontinued metformin after 5 days due to diarrhea.  She is willing to try again.  She may take Imodium as needed.  Most times this abates after 2 weeks. -I will refer her to new diabetes education and dietitian services. -She will return in 3 months for follow-up A1c. -We have discussed that healthy lifestyle changes are required.  Anxiety state  Attention deficit hyperactivity disorder (ADHD), unspecified ADHD type -PDMP reviewed, no red flags, overdose risk score is 110. -Refill Adderall XR 20 mg to take 1 tablet daily for 3 months. -Also refill Xanax 0.5 mg for 30 tablets a month x3 months to take as needed.  Dyslipidemia  - Plan: atorvastatin (LIPITOR) 80 MG tablet -Last lipids in December 2021 with a total cholesterol of 221, triglycerides 151 and LDL 154.  Goal LDL less than 70. -Recheck lipids when she returns in person.  Vitamin B12 deficiency Vitamin D deficiency -Recheck levels when she returns in 3  months.     I discussed the assessment and treatment plan with the patient. The patient was provided an opportunity to ask questions and all were answered. The patient agreed with the plan and demonstrated an understanding of the instructions.   The patient was advised to call back or seek an in-person evaluation if the symptoms worsen or if the condition fails to improve as anticipated.    Chaya Jan, MD  [facility] at [facility]

## 2021-05-09 ENCOUNTER — Other Ambulatory Visit: Payer: Self-pay | Admitting: *Deleted

## 2021-05-09 DIAGNOSIS — E785 Hyperlipidemia, unspecified: Secondary | ICD-10-CM

## 2021-05-09 DIAGNOSIS — F411 Generalized anxiety disorder: Secondary | ICD-10-CM

## 2021-05-09 MED ORDER — METFORMIN HCL 500 MG PO TABS: 500.0000 mg | ORAL_TABLET | Freq: Two times a day (BID) | ORAL | 1 refills | Status: DC

## 2021-05-09 MED ORDER — SERTRALINE HCL 50 MG PO TABS: 50.0000 mg | ORAL_TABLET | Freq: Every day | ORAL | 1 refills | Status: DC

## 2021-05-09 MED ORDER — ATORVASTATIN CALCIUM 80 MG PO TABS: 80.0000 mg | ORAL_TABLET | Freq: Every day | ORAL | 1 refills | Status: DC

## 2021-05-09 NOTE — Addendum Note (Signed)
Addended by: Kern Reap B on: 05/09/2021 11:54 AM   Modules accepted: Orders

## 2021-05-30 ENCOUNTER — Ambulatory Visit
Admission: RE | Admit: 2021-05-30 | Discharge: 2021-05-30 | Disposition: A | Discharge status: Home / Self Care | Payer: 59 | Source: Ambulatory Visit | Attending: Gynecology | Admitting: Gynecology

## 2021-05-30 ENCOUNTER — Other Ambulatory Visit: Payer: Self-pay

## 2021-05-30 ENCOUNTER — Other Ambulatory Visit: Payer: Self-pay | Admitting: Gynecology

## 2021-05-30 DIAGNOSIS — R921 Mammographic calcification found on diagnostic imaging of breast: Secondary | ICD-10-CM

## 2021-08-02 ENCOUNTER — Other Ambulatory Visit: Payer: Self-pay

## 2021-08-03 ENCOUNTER — Ambulatory Visit: Payer: 59 | Admitting: Internal Medicine

## 2021-08-10 ENCOUNTER — Ambulatory Visit: Payer: 59 | Admitting: Internal Medicine

## 2021-08-21 ENCOUNTER — Other Ambulatory Visit: Payer: Self-pay | Admitting: Internal Medicine

## 2021-08-21 DIAGNOSIS — F909 Attention-deficit hyperactivity disorder, unspecified type: Secondary | ICD-10-CM

## 2021-08-21 NOTE — Telephone Encounter (Signed)
Patient is calling to get a three month refill on her amphetamine-dextroamphetamine (ADDERALL XR) 20 MG 24 hr capsule  Patient is going out of town and only has a couple days left.    Please Send to  [id number] [place], [place] - [address] Phone:  [phone]  Fax:  [phone]      Good callback number is [phone]     Please advise

## 2021-08-22 MED ORDER — AMPHETAMINE-DEXTROAMPHET ER 20 MG PO CP24: 20.0000 mg | ORAL_CAPSULE | ORAL | 0 refills | Status: DC

## 2021-08-22 NOTE — Telephone Encounter (Signed)
Patient has scheduled an appointment for 09/11/21.

## 2021-09-11 ENCOUNTER — Other Ambulatory Visit: Payer: Self-pay

## 2021-09-11 ENCOUNTER — Ambulatory Visit: Payer: 59 | Admitting: Internal Medicine

## 2021-09-11 DIAGNOSIS — E119 Type 2 diabetes mellitus without complications: Secondary | ICD-10-CM

## 2021-09-13 ENCOUNTER — Other Ambulatory Visit: Payer: Self-pay | Admitting: Internal Medicine

## 2021-09-13 DIAGNOSIS — F411 Generalized anxiety disorder: Secondary | ICD-10-CM

## 2021-09-13 DIAGNOSIS — E785 Hyperlipidemia, unspecified: Secondary | ICD-10-CM

## 2021-09-14 ENCOUNTER — Other Ambulatory Visit: Payer: 59

## 2021-09-20 ENCOUNTER — Other Ambulatory Visit: Payer: Self-pay

## 2021-09-20 ENCOUNTER — Encounter: Payer: Self-pay | Admitting: Internal Medicine

## 2021-09-20 ENCOUNTER — Ambulatory Visit: Payer: 59 | Admitting: Internal Medicine

## 2021-09-20 VITALS — BP 110/80 | HR 101 | Temp 97.9°F | Wt 278.2 lb

## 2021-09-20 DIAGNOSIS — R079 Chest pain, unspecified: Secondary | ICD-10-CM

## 2021-09-20 DIAGNOSIS — E785 Hyperlipidemia, unspecified: Secondary | ICD-10-CM | POA: Diagnosis not present

## 2021-09-20 DIAGNOSIS — F909 Attention-deficit hyperactivity disorder, unspecified type: Secondary | ICD-10-CM | POA: Diagnosis not present

## 2021-09-20 DIAGNOSIS — E119 Type 2 diabetes mellitus without complications: Secondary | ICD-10-CM | POA: Diagnosis not present

## 2021-09-20 DIAGNOSIS — F17200 Nicotine dependence, unspecified, uncomplicated: Secondary | ICD-10-CM

## 2021-09-20 LAB — COMPREHENSIVE METABOLIC PANEL
ALT: 19 U/L (ref 0–35)
AST: 18 U/L (ref 0–37)
Albumin: 4.4 g/dL (ref 3.5–5.2)
Alkaline Phosphatase: 69 U/L (ref 39–117)
BUN: 12 mg/dL (ref 6–23)
CO2: 29 mEq/L (ref 19–32)
Calcium: 9.9 mg/dL (ref 8.4–10.5)
Chloride: 101 mEq/L (ref 96–112)
Creatinine, Ser: 0.92 mg/dL (ref 0.40–1.20)
GFR: 76.1 mL/min (ref >60.00)
Glucose, Bld: 118 mg/dL — ABNORMAL HIGH (ref 70–99)
Potassium: 3.9 mEq/L (ref 3.5–5.1)
Sodium: 137 mEq/L (ref 135–145)
Total Bilirubin: 1.4 mg/dL — ABNORMAL HIGH (ref 0.2–1.2)
Total Protein: 7.5 g/dL (ref 6.0–8.3)

## 2021-09-20 LAB — CBC WITH DIFFERENTIAL/PLATELET
Basophils Absolute: 0 10*3/uL (ref 0.0–0.1)
Basophils Relative: 0.5 % (ref 0.0–3.0)
Eosinophils Absolute: 0.2 10*3/uL (ref 0.0–0.7)
Eosinophils Relative: 2.8 % (ref 0.0–5.0)
HCT: 41.4 % (ref 36.0–46.0)
Hemoglobin: 13.7 g/dL (ref 12.0–15.0)
Lymphocytes Relative: 33 % (ref 12.0–46.0)
Lymphs Abs: 2.1 10*3/uL (ref 0.7–4.0)
MCHC: 33.2 g/dL (ref 30.0–36.0)
MCV: 86.2 fl (ref 78.0–100.0)
Monocytes Absolute: 0.2 10*3/uL (ref 0.1–1.0)
Monocytes Relative: 3.8 % (ref 3.0–12.0)
Neutro Abs: 3.8 10*3/uL (ref 1.4–7.7)
Neutrophils Relative %: 59.9 % (ref 43.0–77.0)
Platelets: 243 10*3/uL (ref 150.0–400.0)
RBC: 4.8 Mil/uL (ref 3.87–5.11)
RDW: 14.6 % (ref 11.5–15.5)
WBC: 6.4 10*3/uL (ref 4.0–10.5)

## 2021-09-20 LAB — MICROALBUMIN / CREATININE URINE RATIO
Creatinine,U: 262.5 mg/dL
Microalb Creat Ratio: 0.7 mg/g (ref 0.0–30.0)
Microalb, Ur: 1.8 mg/dL (ref 0.0–1.9)

## 2021-09-20 LAB — LIPID PANEL
Cholesterol: 199 mg/dL (ref 0–200)
HDL: 39.2 mg/dL (ref >39.00)
LDL Cholesterol: 133 mg/dL — ABNORMAL HIGH (ref 0–99)
NonHDL: 159.75
Total CHOL/HDL Ratio: 5
Triglycerides: 136 mg/dL (ref 0.0–149.0)
VLDL: 27.2 mg/dL (ref 0.0–40.0)

## 2021-09-20 LAB — POCT GLYCOSYLATED HEMOGLOBIN (HGB A1C): Hemoglobin A1C: 6.3 % — AB (ref 4.0–5.6)

## 2021-09-20 MED ORDER — AMPHETAMINE-DEXTROAMPHET ER 20 MG PO CP24: 20.0000 mg | ORAL_CAPSULE | ORAL | 0 refills | Status: DC

## 2021-09-20 NOTE — Progress Notes (Signed)
Established Patient Office Visit     This visit occurred during the SARS-CoV-2 public health emergency.  Safety protocols were in place, including screening questions prior to the visit, additional usage of staff PPE, and extensive cleaning of exam room while observing appropriate contact time as indicated for disinfecting solutions.    CC/Reason for Visit: 55-month follow-up chronic medical conditions  HPI: Angela Townsend is a 45 y.o. female who is coming in today for the above mentioned reasons. Past Medical History is significant for: ADHD on Adderall, type 2 diabetes, hyperlipidemia, ongoing tobacco use of around 5 cigarettes a day as well as generalized anxiety disorder on sertraline and as needed alprazolam.  She describes today a sensation of chest pain that is located on both the left and right anterior thoracic wall, pain is not related with exertion, lasts for few seconds, feels like a sharp, stabbing pain, she will at times have palpitations, denies dyspnea on exertion or shortness of breath in general.   Past Medical/Surgical History: Past Medical History:  Diagnosis Date   ADD 01/02/2009   Headache(784.0) 01/02/2009   HYPERLIPIDEMIA 01/02/2009   Obesity    TOBACCO ABUSE 01/02/2009    Past Surgical History:  Procedure Laterality Date   WISDOM TOOTH EXTRACTION      Social History:  reports that she has been smoking cigarettes. She has been smoking an average of .5 packs per day. She has never used smokeless tobacco. She reports current alcohol use. She reports that she does not use drugs.  Allergies: No Known Allergies  Family History:  Family History  Problem Relation Age of Onset   Diabetes Mother    Hyperlipidemia Mother    Birth defects Maternal Grandmother        breast, colon, brain ca     Current Outpatient Medications:    ALPRAZolam (XANAX) 0.5 MG tablet, Take 1 tablet (0.5 mg total) by mouth 3 (three) times daily as needed for anxiety (panic  attacks)., Disp: 30 tablet, Rfl: 1   atorvastatin (LIPITOR) 80 MG tablet, TAKE 1 TABLET BY MOUTH  DAILY AT 6 PM., Disp: 90 tablet, Rfl: 0   Melatonin 5 MG TABS, Take 5 mg by mouth at bedtime as needed (sleep)., Disp: , Rfl:    metFORMIN (GLUCOPHAGE) 500 MG tablet, TAKE 1 TABLET BY MOUTH  TWICE DAILY WITH MEALS, Disp: 180 tablet, Rfl: 0   sertraline (ZOLOFT) 50 MG tablet, TAKE 1 TABLET BY MOUTH  DAILY, Disp: 90 tablet, Rfl: 0   amphetamine-dextroamphetamine (ADDERALL XR) 20 MG 24 hr capsule, Take 1 capsule (20 mg total) by mouth every morning., Disp: 30 capsule, Rfl: 0   amphetamine-dextroamphetamine (ADDERALL XR) 20 MG 24 hr capsule, Take 1 capsule (20 mg total) by mouth every morning., Disp: 30 capsule, Rfl: 0   amphetamine-dextroamphetamine (ADDERALL XR) 20 MG 24 hr capsule, Take 1 capsule (20 mg total) by mouth every morning., Disp: 30 capsule, Rfl: 0  Review of Systems:  Constitutional: Denies fever, chills, diaphoresis, appetite change and fatigue.  HEENT: Denies photophobia, eye pain, redness, hearing loss, ear pain, congestion, sore throat, rhinorrhea, sneezing, mouth sores, trouble swallowing, neck pain, neck stiffness and tinnitus.   Respiratory: Denies SOB, DOE, cough, chest tightness,  and wheezing.   Cardiovascular: Denies  leg swelling.  Gastrointestinal: Denies nausea, vomiting, abdominal pain, diarrhea, constipation, blood in stool and abdominal distention.  Genitourinary: Denies dysuria, urgency, frequency, hematuria, flank pain and difficulty urinating.  Endocrine: Denies: hot or cold intolerance, sweats, changes  in hair or nails, polyuria, polydipsia. Musculoskeletal: Denies myalgias, back pain, joint swelling, arthralgias and gait problem.  Skin: Denies pallor, rash and wound.  Neurological: Denies dizziness, seizures, syncope, weakness, light-headedness, numbness and headaches.  Hematological: Denies adenopathy. Easy bruising, personal or family bleeding history   Psychiatric/Behavioral: Denies suicidal ideation, mood changes, confusion, nervousness, sleep disturbance and agitation    Physical Exam: Vitals:   09/20/21 0805  BP: 110/80  Pulse: (!) 101  Temp: 97.9 F (36.6 C)  TempSrc: Oral  SpO2: 96%  Weight: 278 lb 3.2 oz (126.2 kg)    Body mass index is 44.9 kg/m.   Constitutional: NAD, calm, comfortable, obese Eyes: PERRL, lids and conjunctivae normal, wears corrective lenses ENMT: Mucous membranes are moist.  Respiratory: clear to auscultation bilaterally, no wheezing, no crackles. Normal respiratory effort. No accessory muscle use.  Cardiovascular: Regular rate and rhythm, no murmurs / rubs / gallops. No extremity edema.  Neurologic: Grossly intact and nonfocal Psychiatric: Normal judgment and insight. Alert and oriented x 3. Normal mood.    Impression and Plan:  Type 2 diabetes mellitus without complication, without long-term current use of insulin ([place])  - Plan: POCT glycosylated hemoglobin (Hb A1C), CBC with Differential/Platelet, Comprehensive metabolic panel, Microalbumin/Creatinine Ratio, Urine -A1c demonstrates good control at 6.3 today. -She is only taking metformin at bedtime and I believe this is okay for now as long as her A1c remains at goal. -Check microalbumin today, follow-up lipids and liver function.  Attention deficit hyperactivity disorder (ADHD), unspecified ADHD type -PDMP reviewed, no red flags, overdose risk or is 120. -Refill Adderall XR 20 mg to take 1 tablet daily for total of 30 tablets a month x3 months.  Chest pain, unspecified type  - Plan: EKG 12-Lead, Ambulatory referral to Cardiology -EKG performed in office today and interpreted by myself as normal sinus rhythm, normal axis, no acute ST or T wave changes. -Chest pain is very atypical, however due to her risk factors of ongoing tobacco use, morbid obesity, hyperlipidemia and type 2 diabetes I believe she would benefit from further work-up in  the form of either a stress test or coronary CT, I will refer her today to cardiology.  Dyslipidemia  - Plan: Lipid panel -She has been taking atorvastatin 80 mg daily and is tolerating it well..  Morbid obesity ([place]) -Discussed healthy lifestyle, including increased physical activity and better food choices to promote weight loss. -She is again encouraged to reach out to dietitian, referral has previously been made.  TOBACCO ABUSE -We have not had time to discuss smoking cessation today.  Time spent: 34 minutes reviewing chart, interviewing and examining patient and formulating plan of care.    Lelon Frohlich, MD [facility] at [facility]

## 2021-09-21 ENCOUNTER — Other Ambulatory Visit: Payer: Self-pay | Admitting: Internal Medicine

## 2021-09-21 ENCOUNTER — Encounter: Payer: Self-pay | Admitting: Internal Medicine

## 2021-09-25 NOTE — Progress Notes (Signed)
That is probably fine unless she is having pain right now Cardiology Office Note:    Date:  09/27/2021   ID:  Angela Townsend, DOB October 16, 1976, MRN [id number]  PCP:  Philip Aspen, Limmie Patricia, MD  Cardiologist:  None  Electrophysiologist:  None   Referring MD: Philip Aspen, [place]*   Chief Complaint/Reason for Referral: Chest pain  History of Present Illness:    PROBLEM LIST: 1.  Ongoing tobacco abuse 2.  Type 2 diabetes on metformin 3.  ADHD 4.  Hyperlipidemia 5.  Anxiety 6.  Episode of isolated SVT 2017 (stress and and echocardiogram WNL)  Angela Townsend is a 45 y.o. female with the indicated history here for recommendations regarding chest pain.  The patient was recently seen by her primary care provider and described sharp stabbing pain lasting a few seconds.  It is not related to exertion.  She at times will have palpitations but otherwise denied any other symptoms including dyspnea on exertion.  The patient tells me that she will occasionally get chest discomfort that is migratory.  It will be across her chest at times.  It typically happens when she is sitting down at work.  It can last few seconds to few minutes.  It sometimes comes on with exertion but not reliably so.  She denies any significant shortness of breath, or frank syncope.  She does get a little orthostatic when she goes from sitting to standing quickly however.  She did have an episode of SVT on a cruise in 2017 which was self-limited.  She had an echocardiogram and stress testing which was negative.  She has had no recurrence of SVT symptoms.  She occasionally gets very rare palpitations are not bothersome, associated with chest pain or shortness of breath.  She denies any peripheral edema, paroxysmal nocturnal dyspnea, orthopnea, signs symptoms stroke, severe bleeding, or any need for emergency room visits or hospitalizations recently.  Past Medical History:  Diagnosis Date   ADD 01/02/2009   Chest  pain    DM (diabetes mellitus) (HCC)    Headache(784.0) 01/02/2009   HYPERLIPIDEMIA 01/02/2009   Obesity    TOBACCO ABUSE 01/02/2009   Vitamin D deficiency     Past Surgical History:  Procedure Laterality Date   WISDOM TOOTH EXTRACTION      Current Medications: Current Meds  Medication Sig   ALPRAZolam (XANAX) 0.5 MG tablet Take 1 tablet (0.5 mg total) by mouth 3 (three) times daily as needed for anxiety (panic attacks).   amphetamine-dextroamphetamine (ADDERALL XR) 20 MG 24 hr capsule Take 1 capsule (20 mg total) by mouth every morning.   atorvastatin (LIPITOR) 80 MG tablet TAKE 1 TABLET BY MOUTH  DAILY AT 6 PM.   Melatonin 5 MG TABS Take 5 mg by mouth at bedtime as needed (sleep).   metFORMIN (GLUCOPHAGE) 500 MG tablet TAKE 1 TABLET BY MOUTH  TWICE DAILY WITH MEALS   sertraline (ZOLOFT) 50 MG tablet TAKE 1 TABLET BY MOUTH  DAILY     Allergies:   Patient has no known allergies.   Social History   Tobacco Use   Smoking status: Every Day    Packs/day: 0.50    Types: Cigarettes   Smokeless tobacco: Never  Vaping Use   Vaping Use: Never used  Substance Use Topics   Alcohol use: Yes    Comment: occasional   Drug use: No     Family History: The patient's family history includes Birth defects in her maternal grandmother; Diabetes in  her mother; Hyperlipidemia in her mother.  ROS:   Please see the history of present illness.    All other systems reviewed and are negative.  EKGs/Labs/Other Studies Reviewed:    The following studies were reviewed today:  EKG:  EKG from November 3 demonstrates normal sinus rhythm with no Q waves or ST changes.   Imaging studies that I have independently reviewed today: Echocardiogram from 2017 demonstrating normal ejection fraction with no significant valvular  abnormalities  Recent Labs: 10/27/2020: TSH 1.39 09/20/2021: ALT 19; BUN 12; Creatinine, Ser 0.92; Hemoglobin 13.7; Platelets 243.0; Potassium 3.9; Sodium 137   Recent Lipid  Panel    Component Value Date/Time   CHOL 199 09/20/2021 0848   TRIG 136.0 09/20/2021 0848   HDL 39.20 09/20/2021 0848   CHOLHDL 5 09/20/2021 0848   VLDL 27.2 09/20/2021 0848   LDLCALC 133 (H) 09/20/2021 0848   LDLCALC 154 (H) 10/27/2020 0838   LDLDIRECT 189.0 08/28/2015 0949    Physical Exam:    VS:  BP 124/90   Pulse (!) 111   Ht 5\' 8"  (1.727 m)   Wt 278 lb (126.1 kg)   LMP 09/11/2021 (Approximate)   SpO2 98%   BMI 42.27 kg/m     Wt Readings from Last 5 Encounters:  09/27/21 278 lb (126.1 kg)  09/20/21 278 lb 3.2 oz (126.2 kg)  04/20/21 270 lb (122.5 kg)  10/27/20 292 lb 12.8 oz (132.8 kg)  03/31/20 286 lb (129.7 kg)    GENERAL:  No apparent distress, AOx3 HEENT:  No carotid bruits, +2 carotid impulses, no scleral icterus CAR: RRR no murmurs, gallops, rubs, or thrills RES:  Clear to auscultation bilaterally ABD:  Soft, nontender, nondistended, positive bowel sounds x 4 VASC:  +2 radial pulses, +2 carotid pulses, palpable pedal pulses NEURO:  CN 2-12 grossly intact; motor and sensory grossly intact PSYCH:  No active depression or anxiety  ASSESSMENT:    1. Chest pain of uncertain etiology   2. Type 2 diabetes mellitus with complication, without long-term current use of insulin (HCC)   3. Hyperlipidemia, unspecified hyperlipidemia type   4. Precordial pain    PLAN:    Chest pain of uncertain etiology Will obtain CTA and echocardiogram.  We will keep follow-up with me open-ended depending on the results of the CTA study.  Type 2 diabetes mellitus with complication, without long-term current use of insulin (HCC) Start aspirin 81mg  and continue atorvastatin.  Start Jardiance 10mg  qday.    Hyperlipidemia, unspecified hyperlipidemia type Followed by PCP, goal LDL < 70.    I spent 30 minutes interviewing and examining the patient, reviewing all relevant clinical data, discussing pathophysiology and prognosis, and formulating a plan.  Shared Decision  Making/Informed Consent:       Medication Adjustments/Labs and Tests Ordered: Current medicines are reviewed at length with the patient today.  Concerns regarding medicines are outlined above.   No orders of the defined types were placed in this encounter.   No orders of the defined types were placed in this encounter.   There are no Patient Instructions on file for this visit.

## 2021-09-26 ENCOUNTER — Encounter: Payer: Self-pay | Admitting: *Deleted

## 2021-09-26 ENCOUNTER — Other Ambulatory Visit: Payer: Self-pay | Admitting: Internal Medicine

## 2021-09-26 DIAGNOSIS — E785 Hyperlipidemia, unspecified: Secondary | ICD-10-CM

## 2021-09-27 ENCOUNTER — Other Ambulatory Visit: Payer: Self-pay

## 2021-09-27 ENCOUNTER — Ambulatory Visit (INDEPENDENT_AMBULATORY_CARE_PROVIDER_SITE_OTHER): Payer: 59 | Admitting: Internal Medicine

## 2021-09-27 ENCOUNTER — Encounter: Payer: Self-pay | Admitting: Internal Medicine

## 2021-09-27 VITALS — BP 124/90 | HR 111 | Ht 68.0 in | Wt 278.0 lb

## 2021-09-27 DIAGNOSIS — E118 Type 2 diabetes mellitus with unspecified complications: Secondary | ICD-10-CM | POA: Diagnosis not present

## 2021-09-27 DIAGNOSIS — E785 Hyperlipidemia, unspecified: Secondary | ICD-10-CM

## 2021-09-27 DIAGNOSIS — R072 Precordial pain: Secondary | ICD-10-CM | POA: Diagnosis not present

## 2021-09-27 DIAGNOSIS — R079 Chest pain, unspecified: Secondary | ICD-10-CM

## 2021-09-27 MED ORDER — METOPROLOL TARTRATE 100 MG PO TABS: 100.0000 mg | ORAL_TABLET | Freq: Once | ORAL | 0 refills | Status: DC

## 2021-09-27 MED ORDER — EMPAGLIFLOZIN 10 MG PO TABS: 10.0000 mg | ORAL_TABLET | Freq: Every day | ORAL | 11 refills | Status: DC

## 2021-09-27 MED ORDER — ASPIRIN EC 81 MG PO TBEC: 81.0000 mg | DELAYED_RELEASE_TABLET | Freq: Every day | ORAL | 3 refills | Status: DC

## 2021-09-27 NOTE — Patient Instructions (Addendum)
Medication Instructions:  Your physician has recommended you make the following change in your medication:  1.) start aspirin 81 mg (enteric coated) - one tablet daily 2.) start Jardiance 10 mg - one tablet daily  *If you need a refill on your cardiac medications before your next appointment, please call your pharmacy*   Lab Work: None   If you have labs (blood work) drawn today and your tests are completely normal, you will receive your results only by: MyChart Message (if you have MyChart) OR A paper copy in the mail If you have any lab test that is abnormal or we need to change your treatment, we will call you to review the results.   Testing/Procedures: Cardiac CT - see instructions below   Follow-Up: As needed.  Other Instructions   Your cardiac CT will be scheduled the below location:   [facility] [address] [place], [place] [address] [phone]   Please arrive at the [facility] main entrance (entrance A) of [facility] 30 minutes prior to test start time. You can use the FREE valet parking offered at the main entrance (encouraged to control the heart rate for the test) Proceed to the [facility] Radiology Department (first floor) to check-in and test prep.  Please follow these instructions carefully (unless otherwise directed):   On the Night Before the Test: Be sure to Drink plenty of water. Do not consume any caffeinated/decaffeinated beverages or chocolate 12 hours prior to your test. Do not take any antihistamines 12 hours prior to your test.  On the Day of the Test: Drink plenty of water until 1 hour prior to the test. Do not eat any food 4 hours prior to the test. You may take your regular medications prior to the test.  Take metoprolol (Lopressor) two hours prior to test. FEMALES- please wear underwire-free bra if available, avoid dresses & tight clothing       After the Test: Drink plenty of water. After receiving IV  contrast, you may experience a mild flushed feeling. This is normal. On occasion, you may experience a mild rash up to 24 hours after the test. This is not dangerous. If this occurs, you can take Benadryl 25 mg and increase your fluid intake. If you experience trouble breathing, this can be serious. If it is severe call 911 IMMEDIATELY. If it is mild, please call our office. Please do not take METFORMIN 48 hours after completing test unless otherwise instructed.  Please allow 2-4 weeks for scheduling of routine cardiac CTs. Some insurance companies require a pre-authorization which may delay scheduling of this test.   For non-scheduling related questions, please contact the cardiac imaging nurse navigator should you have any questions/concerns: Rockwell Alexandria, Cardiac Imaging Nurse Navigator [name], Cardiac Imaging Nurse Navigator  [facility] Direct Office Dial: [phone]   For scheduling needs, including cancellations and rescheduling, please call [place], [phone].

## 2021-10-09 ENCOUNTER — Telehealth (HOSPITAL_COMMUNITY): Payer: Self-pay | Admitting: *Deleted

## 2021-10-09 NOTE — Telephone Encounter (Signed)
Attempted to call patient regarding upcoming cardiac CT appointment. °Left message on voicemail with name and callback number ° °Terryl Niziolek RN Navigator Cardiac Imaging °[facility] °[phone] Office °[phone] Cell ° °

## 2021-10-10 ENCOUNTER — Telehealth (HOSPITAL_COMMUNITY): Payer: Self-pay | Admitting: *Deleted

## 2021-10-10 NOTE — Telephone Encounter (Signed)
Reaching out to patient to offer assistance regarding upcoming cardiac imaging study; pt verbalizes understanding of appt date/time, parking situation and where to check in, pre-test NPO status and medications ordered, and verified current allergies; name and call back number provided for further questions should they arise  [name] RN Navigator Cardiac Imaging [facility] [phone] office [phone] cell  Patient reports being nervous at doctor's visits and was encouraged to take her Xanax for her test. She states her husband will be driving her. She will take 100mg  metoprolol tartrate two hours prior to test and hold her adderall.  She is aware to arrive no later than 7:30am for the 7:45am scan.

## 2021-10-12 ENCOUNTER — Other Ambulatory Visit: Payer: Self-pay

## 2021-10-12 ENCOUNTER — Other Ambulatory Visit (HOSPITAL_COMMUNITY): Payer: Self-pay | Admitting: Emergency Medicine

## 2021-10-12 ENCOUNTER — Ambulatory Visit (HOSPITAL_COMMUNITY)
Admission: RE | Admit: 2021-10-12 | Discharge: 2021-10-12 | Disposition: A | Discharge status: Home / Self Care | Payer: 59 | Source: Ambulatory Visit | Attending: Internal Medicine | Admitting: Internal Medicine

## 2021-10-12 ENCOUNTER — Encounter (HOSPITAL_COMMUNITY): Payer: Self-pay

## 2021-10-12 DIAGNOSIS — R079 Chest pain, unspecified: Secondary | ICD-10-CM | POA: Diagnosis present

## 2021-10-12 DIAGNOSIS — R072 Precordial pain: Secondary | ICD-10-CM | POA: Insufficient documentation

## 2021-10-12 DIAGNOSIS — E118 Type 2 diabetes mellitus with unspecified complications: Secondary | ICD-10-CM | POA: Diagnosis present

## 2021-10-12 DIAGNOSIS — E785 Hyperlipidemia, unspecified: Secondary | ICD-10-CM | POA: Diagnosis present

## 2021-10-12 MED ORDER — IVABRADINE HCL 5 MG PO TABS
15.0000 mg | ORAL_TABLET | Freq: Once | ORAL | 0 refills | Status: AC
Start: 2021-10-12 — End: 2021-10-12

## 2021-10-12 MED ORDER — DILTIAZEM HCL 25 MG/5ML IV SOLN
INTRAVENOUS | Status: AC
  Administered 2021-10-12: 5 mg via INTRAVENOUS
  Filled 2021-10-12: qty 5

## 2021-10-12 MED ORDER — DILTIAZEM HCL 25 MG/5ML IV SOLN: 5.0000 mg | INTRAVENOUS | Status: DC | PRN

## 2021-10-12 MED ORDER — NITROGLYCERIN 0.4 MG SL SUBL: 0.8000 mg | SUBLINGUAL_TABLET | Freq: Once | SUBLINGUAL | Status: DC

## 2021-10-12 NOTE — Progress Notes (Signed)
Pt's heart rate remains in the low to mid 70s even with breath hold after 5 mg of cardizem administered. Pt's SBP now in the 100s and pt symptomatic with drowsiness and fatigue. Dr. Servando Salina notified. Per MD, abort CT at this time and pt will be rescheduled. [place] Dec CT navigator informed.

## 2021-10-24 ENCOUNTER — Telehealth (HOSPITAL_COMMUNITY): Payer: Self-pay | Admitting: *Deleted

## 2021-10-24 ENCOUNTER — Other Ambulatory Visit (HOSPITAL_COMMUNITY): Payer: Self-pay | Admitting: *Deleted

## 2021-10-24 DIAGNOSIS — Z01812 Encounter for preprocedural laboratory examination: Secondary | ICD-10-CM

## 2021-10-24 NOTE — Telephone Encounter (Signed)
Reaching out to patient to offer assistance regarding upcoming cardiac imaging study; pt verbalizes understanding of appt date/time, parking situation and where to check in, pre-test NPO status and medications ordered, and verified current allergies; name and call back number provided for further questions should they arise  Angela Brick RN Navigator Cardiac Imaging [facility] [phone] office [phone] cell  Patient to take 15mg  ivabradine two hours prior to cardiac CT scan. She is aware to arrive no later than 7:30 for her 7:45am scan.

## 2021-10-26 ENCOUNTER — Telehealth: Payer: Self-pay | Admitting: *Deleted

## 2021-10-26 ENCOUNTER — Other Ambulatory Visit: Payer: Self-pay

## 2021-10-26 ENCOUNTER — Encounter (HOSPITAL_COMMUNITY): Payer: Self-pay

## 2021-10-26 ENCOUNTER — Encounter: Payer: Self-pay | Admitting: *Deleted

## 2021-10-26 ENCOUNTER — Ambulatory Visit (HOSPITAL_COMMUNITY)
Admission: RE | Admit: 2021-10-26 | Discharge: 2021-10-26 | Disposition: A | Discharge status: Home / Self Care | Payer: 59 | Source: Ambulatory Visit | Attending: Internal Medicine | Admitting: Internal Medicine

## 2021-10-26 DIAGNOSIS — R079 Chest pain, unspecified: Secondary | ICD-10-CM

## 2021-10-26 NOTE — Progress Notes (Signed)
Pt's heart rate in the high 80s even after taking 15mg  ivabradine prior to arrival. SBP remains in the 110s. Dr. notified of current vital signs. Informed of similar situation from previous attempt. Per MD, abort CT at this time and will recommend another exam for cardiac evaluation. Pt informed of situation and plan as recommended by Dr. Jens Som. Pt verbalized understanding and is in agreement. Jens Som CT navigator notified.

## 2021-10-26 NOTE — Telephone Encounter (Signed)
Left message for patient to call back or reply to MyChart message.  Order placed for lexiscan myoview. Instruction letter sent to [facility] with message.

## 2021-10-26 NOTE — Telephone Encounter (Signed)
-----   Message from Orbie Pyo, MD sent at 10/26/2021  8:40 AM EST ----- Thanks all for your efforts.  Theus Espin, let's set her up for a Lexiscan.  Thanks. ----- Message ----- From: Lennie Odor, RN Sent: 10/26/2021   8:28 AM EST To: Lewayne Bunting, MD, Orbie Pyo, MD  Dr. Lynnette Caffey, Please see note from Maggie Schwalbe nurse, regarding this patients CCTA. This is her 2nd attempt to scan and we are unable to control her HR. We have tried metoprolol and ivabradine. Please consider alternative testing. Thank you, Rockwell [place]

## 2021-10-29 ENCOUNTER — Telehealth (HOSPITAL_COMMUNITY): Payer: Self-pay | Admitting: *Deleted

## 2021-10-29 NOTE — Telephone Encounter (Signed)
Left message on voicemail per DPR in reference to upcoming appointment scheduled on 11/01/21 at 1:15 with detailed instructions given per Myocardial Perfusion Study Information Sheet for the test. LM to arrive 15 minutes early, and that it is imperative to arrive on time for appointment to keep from having the test rescheduled. If you need to cancel or reschedule your appointment, please call the office within 24 hours of your appointment. Failure to do so may result in a cancellation of your appointment, and a $50 no show fee. Phone number given for call back for any questions.

## 2021-10-29 NOTE — Telephone Encounter (Signed)
Left message for patient to call back or reply in MyChart that she has the message/instructions for upcoming lexi that is scheduled for 11/01/21.

## 2021-10-31 NOTE — Telephone Encounter (Signed)
Per nuc scheduler the patient is aware of her stress test tomorrow.

## 2021-11-01 ENCOUNTER — Ambulatory Visit (HOSPITAL_COMMUNITY): Payer: 59

## 2021-11-02 ENCOUNTER — Ambulatory Visit (HOSPITAL_COMMUNITY): Payer: 59

## 2021-11-06 ENCOUNTER — Encounter (HOSPITAL_COMMUNITY): Payer: Self-pay | Admitting: *Deleted

## 2021-11-06 ENCOUNTER — Telehealth (HOSPITAL_COMMUNITY): Payer: Self-pay | Admitting: *Deleted

## 2021-11-06 NOTE — Telephone Encounter (Signed)
Left message on voicemail per DPR in reference to upcoming appointment scheduled on 11/13/21 at 1300 with detailed instructions given per Myocardial Perfusion Study Information Sheet for the test. LM to arrive 15 minutes early, and that it is imperative to arrive on time for appointment to keep from having the test rescheduled. If you need to cancel or reschedule your appointment, please call the office within 24 hours of your appointment. Failure to do so may result in a cancellation of your appointment, and a $50 no show fee. Phone number given for call back for any questions. Mychart letter sent.Audery Wassenaar, Adelene Idler

## 2021-11-13 ENCOUNTER — Ambulatory Visit (HOSPITAL_COMMUNITY): Payer: 59

## 2021-11-14 ENCOUNTER — Ambulatory Visit (HOSPITAL_COMMUNITY): Payer: 59

## 2021-11-20 ENCOUNTER — Other Ambulatory Visit: Payer: Self-pay | Admitting: Internal Medicine

## 2021-11-20 DIAGNOSIS — E785 Hyperlipidemia, unspecified: Secondary | ICD-10-CM

## 2021-11-22 ENCOUNTER — Encounter (HOSPITAL_COMMUNITY): Payer: Self-pay | Admitting: *Deleted

## 2021-11-22 ENCOUNTER — Telehealth (HOSPITAL_COMMUNITY): Payer: Self-pay | Admitting: *Deleted

## 2021-11-22 NOTE — Telephone Encounter (Signed)
Left message on voicemail per DPR in reference to upcoming appointment scheduled on  11/29/21 with detailed instructions given per Myocardial Perfusion Study Information Sheet for the test. LM to arrive 15 minutes early, and that it is imperative to arrive on time for appointment to keep from having the test rescheduled. If you need to cancel or reschedule your appointment, please call the office within 24 hours of your appointment. Failure to do so may result in a cancellation of your appointment, and a $50 no show fee. Phone number given for call back for any questions. Ricky Ala

## 2021-11-29 ENCOUNTER — Ambulatory Visit (HOSPITAL_COMMUNITY): Payer: 59 | Attending: Internal Medicine

## 2021-11-29 ENCOUNTER — Other Ambulatory Visit: Payer: Self-pay

## 2021-11-29 DIAGNOSIS — R079 Chest pain, unspecified: Secondary | ICD-10-CM

## 2021-11-29 MED ORDER — TECHNETIUM TC 99M TETROFOSMIN IV KIT
32.6000 | PACK | Freq: Once | INTRAVENOUS | Status: AC | PRN
  Administered 2021-11-29: 32.6 via INTRAVENOUS
  Filled 2021-11-29: qty 33

## 2021-11-30 ENCOUNTER — Ambulatory Visit (HOSPITAL_COMMUNITY): Payer: 59

## 2021-11-30 DIAGNOSIS — R079 Chest pain, unspecified: Secondary | ICD-10-CM | POA: Diagnosis not present

## 2021-11-30 LAB — MYOCARDIAL PERFUSION IMAGING
LV dias vol: 85 mL (ref 46–106)
LV sys vol: 33 mL
Nuc Stress EF: 61 %
Peak HR: 133 {beats}/min
Rest HR: 97 {beats}/min
Rest Nuclear Isotope Dose: 32.6 mCi
SDS: 4
SRS: 1
SSS: 5
ST Depression (mm): 0 mm
Stress Nuclear Isotope Dose: 32.2 mCi
TID: 1.21

## 2021-11-30 MED ORDER — REGADENOSON 0.4 MG/5ML IV SOLN
0.4000 mg | Freq: Once | INTRAVENOUS | Status: AC
  Administered 2021-11-30: 0.4 mg via INTRAVENOUS

## 2021-11-30 MED ORDER — TECHNETIUM TC 99M TETROFOSMIN IV KIT
32.2000 | PACK | Freq: Once | INTRAVENOUS | Status: AC | PRN
  Administered 2021-11-30: 32.2 via INTRAVENOUS
  Filled 2021-11-30: qty 33

## 2021-12-07 ENCOUNTER — Other Ambulatory Visit: Payer: Self-pay | Admitting: Internal Medicine

## 2021-12-07 DIAGNOSIS — F411 Generalized anxiety disorder: Secondary | ICD-10-CM

## 2022-01-04 ENCOUNTER — Other Ambulatory Visit: Payer: Self-pay

## 2022-01-04 ENCOUNTER — Ambulatory Visit
Admission: RE | Admit: 2022-01-04 | Discharge: 2022-01-04 | Disposition: A | Discharge status: Home / Self Care | Payer: 59 | Source: Ambulatory Visit | Attending: Gynecology | Admitting: Gynecology

## 2022-01-04 DIAGNOSIS — R921 Mammographic calcification found on diagnostic imaging of breast: Secondary | ICD-10-CM

## 2022-03-12 LAB — HM DIABETES EYE EXAM

## 2022-05-09 ENCOUNTER — Other Ambulatory Visit: Payer: Self-pay | Admitting: Internal Medicine

## 2022-05-09 DIAGNOSIS — E785 Hyperlipidemia, unspecified: Secondary | ICD-10-CM

## 2022-05-21 ENCOUNTER — Other Ambulatory Visit: Payer: Self-pay | Admitting: Internal Medicine

## 2022-05-21 DIAGNOSIS — F411 Generalized anxiety disorder: Secondary | ICD-10-CM

## 2022-05-22 NOTE — Telephone Encounter (Signed)
Patient need to schedule an ov for more refills. 

## 2022-08-08 DIAGNOSIS — Z0279 Encounter for issue of other medical certificate: Secondary | ICD-10-CM

## 2022-10-16 ENCOUNTER — Telehealth (INDEPENDENT_AMBULATORY_CARE_PROVIDER_SITE_OTHER): Payer: 59 | Admitting: Internal Medicine

## 2022-10-16 ENCOUNTER — Encounter: Payer: Self-pay | Admitting: Internal Medicine

## 2022-10-16 VITALS — Ht 67.5 in | Wt 278.0 lb

## 2022-10-16 DIAGNOSIS — Z973 Presence of spectacles and contact lenses: Secondary | ICD-10-CM

## 2022-10-16 DIAGNOSIS — H1031 Unspecified acute conjunctivitis, right eye: Secondary | ICD-10-CM

## 2022-10-16 MED ORDER — POLYMYXIN B-TRIMETHOPRIM 10000-0.1 UNIT/ML-% OP SOLN: 1.0000 [drp] | Freq: Four times a day (QID) | OPHTHALMIC | 0 refills | Status: DC

## 2022-10-16 NOTE — Progress Notes (Signed)
Virtual Visit via Video Note  I connected with Jamiera Phi Sunderland on 10/16/22 at 12:15 PM EST by a video enabled telemedicine application and verified that I am speaking with the correct person using two identifiers. Location patient: home Location provider:work  office Persons participating in the virtual visit: patient, provider   Patient aware  of the limitations of evaluation and management by telemedicine and  availability of in person appointments. and agreed to proceed.   HPI: Angela Townsend presents for video visit     Onset last pm    after taking out contacts  itch and then irritated an dc  hurt to light this am  but no change in vision No change in vision   itch and then  Runny nose  but no uri illness or exposures   no adenopathy  Husband bough her an eye patch to help  ROS: See pertinent positives and negatives per HPI.  Past Medical History:  Diagnosis Date   ADD 01/02/2009   Chest pain    DM (diabetes mellitus) ([place])    Headache(784.0) 01/02/2009   HYPERLIPIDEMIA 01/02/2009   Obesity    TOBACCO ABUSE 01/02/2009   Vitamin D deficiency     Past Surgical History:  Procedure Laterality Date   WISDOM TOOTH EXTRACTION      Family History  Problem Relation Age of Onset   Diabetes Mother    Hyperlipidemia Mother    Birth defects Maternal Grandmother        breast, colon, brain ca    Social History   Tobacco Use   Smoking status: Every Day    Packs/day: 0.50    Types: Cigarettes   Smokeless tobacco: Never  Vaping Use   Vaping Use: Never used  Substance Use Topics   Alcohol use: Yes    Comment: occasional   Drug use: No      Current Outpatient Medications:    ALPRAZolam (XANAX) 0.5 MG tablet, Take 1 tablet (0.5 mg total) by mouth 3 (three) times daily as needed for anxiety (panic attacks)., Disp: 30 tablet, Rfl: 1   amphetamine-dextroamphetamine (ADDERALL XR) 20 MG 24 hr capsule, Take 1 capsule (20 mg total) by mouth every morning., Disp:  30 capsule, Rfl: 0   aspirin EC 81 MG tablet, Take 1 tablet (81 mg total) by mouth daily. Swallow whole., Disp: 90 tablet, Rfl: 3   atorvastatin (LIPITOR) 80 MG tablet, TAKE 1 TABLET BY MOUTH DAILY AT  6 PM., Disp: 90 tablet, Rfl: 1   empagliflozin (JARDIANCE) 10 MG TABS tablet, Take 1 tablet (10 mg total) by mouth daily before breakfast., Disp: 30 tablet, Rfl: 11   Melatonin 5 MG TABS, Take 5 mg by mouth at bedtime as needed (sleep)., Disp: , Rfl:    metFORMIN (GLUCOPHAGE) 500 MG tablet, Take 1 tablet (500 mg total) by mouth 2 (two) times daily with a meal., Disp: 180 tablet, Rfl: 1   sertraline (ZOLOFT) 50 MG tablet, Take 1 tablet (50 mg total) by mouth daily., Disp: 90 tablet, Rfl: 1   trimethoprim-polymyxin b (POLYTRIM) ophthalmic solution, Place 1 drop into the right eye every 6 (six) hours., Disp: 10 mL, Rfl: 0   metoprolol tartrate (LOPRESSOR) 100 MG tablet, Take 1 tablet (100 mg total) by mouth once for 1 dose. Take 90-120 minutes prior to scan., Disp: 1 tablet, Rfl: 0  EXAM: BP Readings from Last 3 Encounters:  10/26/21 116/86  10/12/21 102/80  09/27/21 124/90    VITALS per patient if  applicable:  GENERAL: alert, oriented, appears well and in no acute distress  HEENT: atraumatic, conjunttiva right  middle 1 + redness but no obv ciliary flush and pupil seems normal  no obvious abnormalities on inspection of external nose and ears  NECK: normal movements of the head and neck  LUNGS: on inspection no signs of respiratory distress, breathing rate appears normal, no obvious gross SOB, gasping or wheezing  CV: no obvious cyanosis   PSYCH/NEURO: pleasant and cooperative, no obvious depression or anxiety, speech and thought processing grossly intact   ASSESSMENT AND PLAN:  Discussed the following assessment and plan:    ICD-10-CM   1. Acute conjunctivitis of right eye, unspecified acute conjunctivitis type  H10.31    however with "pain" and  mild photophobia  consider corneal  contact ralated or other ( no hx of iritis)  conservative rx fu eye doc  if not improving in 48 hr    2. Wears contact lenses  Z97.3      Compresses  antibiotic drops No contacts or eye makeup until better   Fu with eye provider if recurrent or ongoing  Counseled.   Expectant management and discussion of plan and treatment with opportunity to ask questions and all were answered. The patient agreed with the plan and demonstrated an understanding of the instructions.   Advised to call back or seek an in-person evaluation if worsening  or having  further concerns  in interim. Return if symptoms worsen or fail to improve as expected.    Berniece Andreas, MD

## 2022-10-24 ENCOUNTER — Other Ambulatory Visit: Payer: Self-pay | Admitting: Internal Medicine

## 2022-10-24 DIAGNOSIS — E785 Hyperlipidemia, unspecified: Secondary | ICD-10-CM

## 2022-11-06 IMAGING — MG MM DIGITAL DIAGNOSTIC UNILAT*R* W/ TOMO W/ CAD
6 series · 6 of 14 positions shown · non-contrast
Comparison: Previous exam(s).

CLINICAL DATA: Six-month follow-up for calcifications in the RIGHT
breast.

EXAM:
DIGITAL DIAGNOSTIC UNILATERAL RIGHT MAMMOGRAM WITH TOMOSYNTHESIS AND
CAD
TECHNIQUE: Right digital diagnostic mammography and breast tomosynthesis was
performed. The images were evaluated with computer-aided detection.

[R ML]
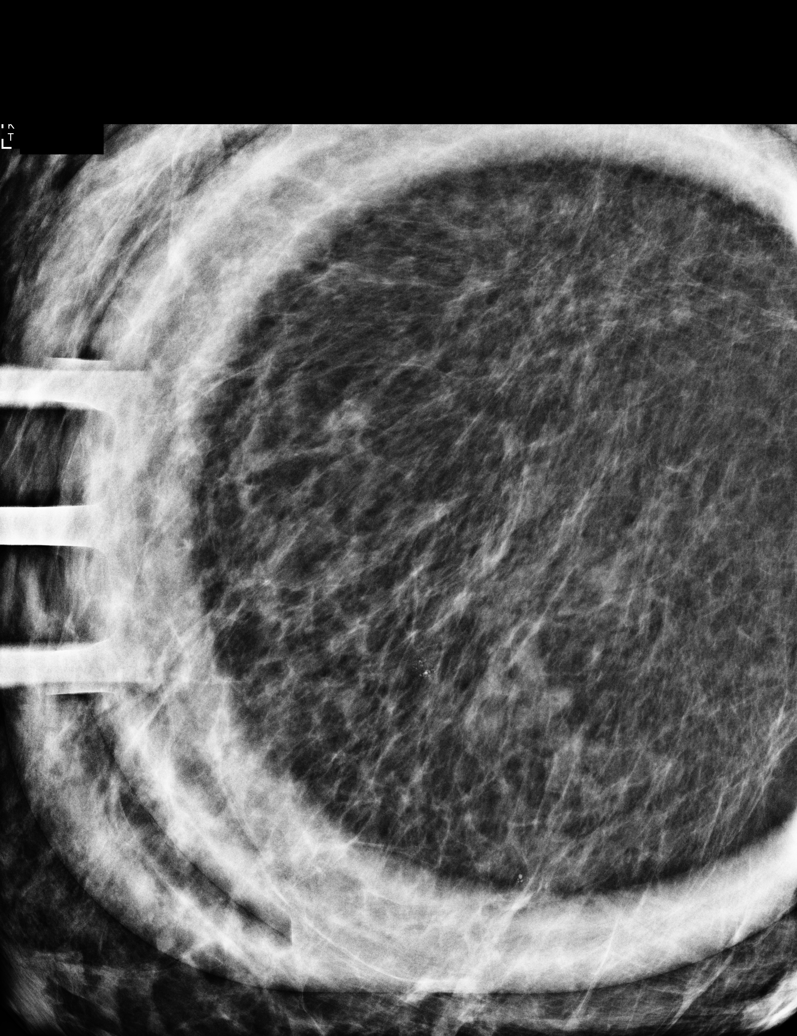

[R CC]
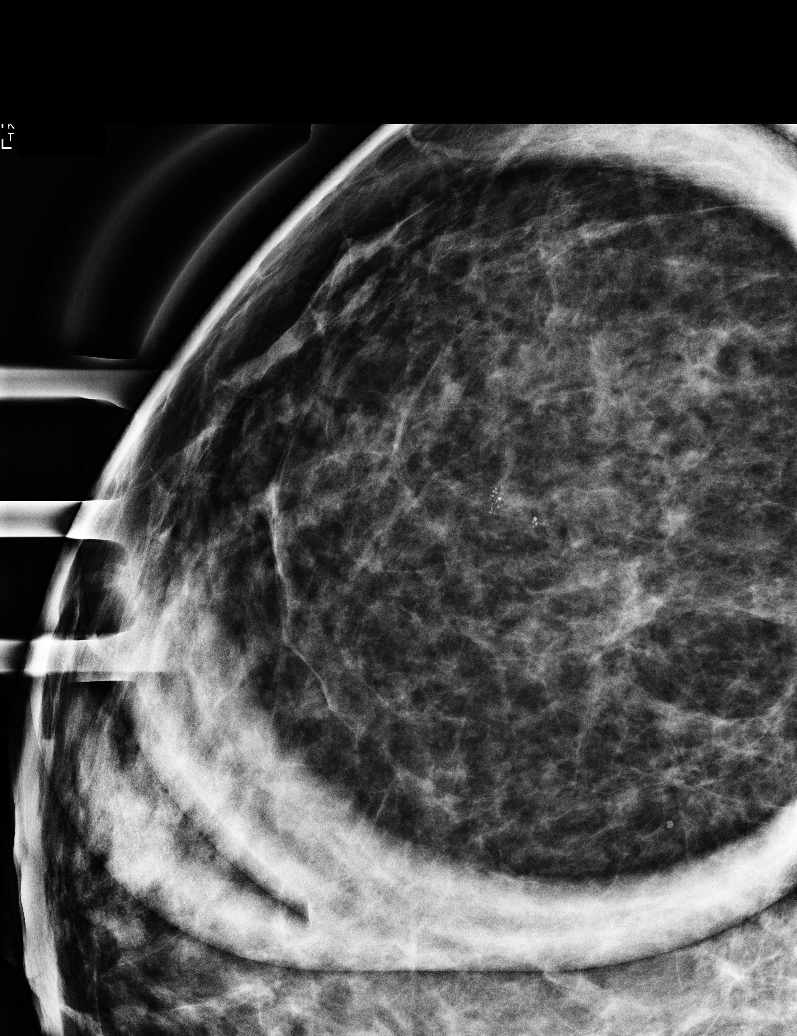

[R MLO synth-2D]
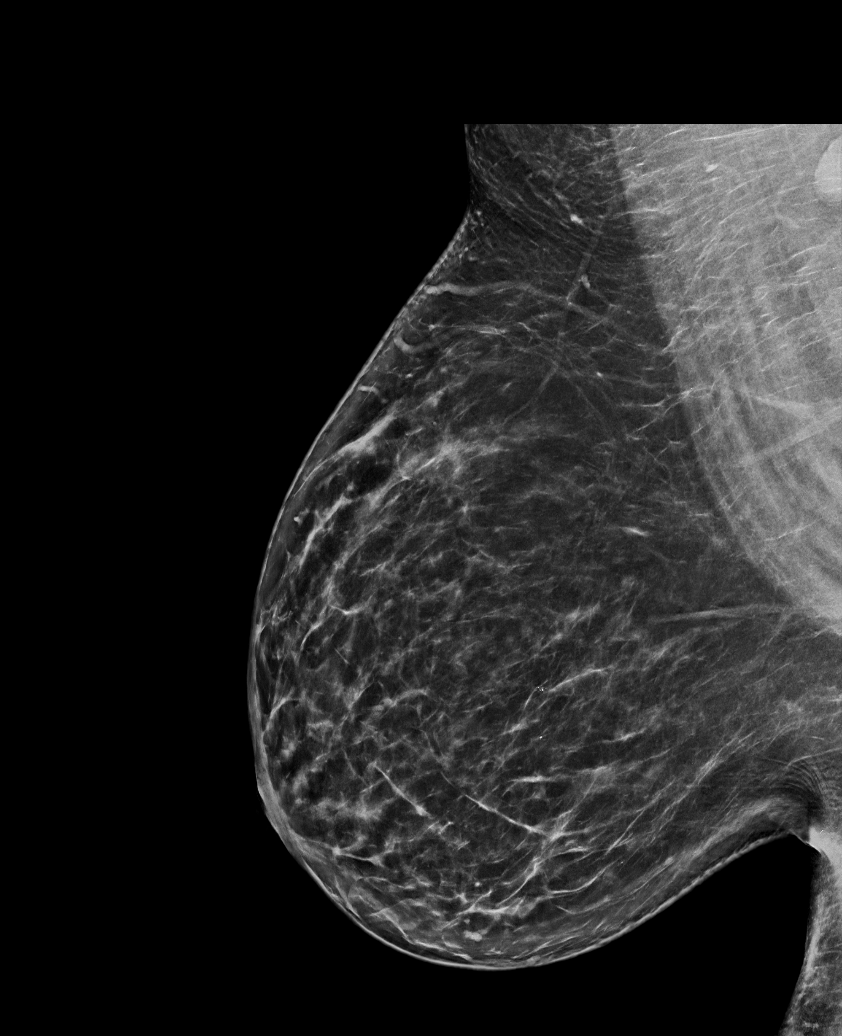

[R CC synth-2D]
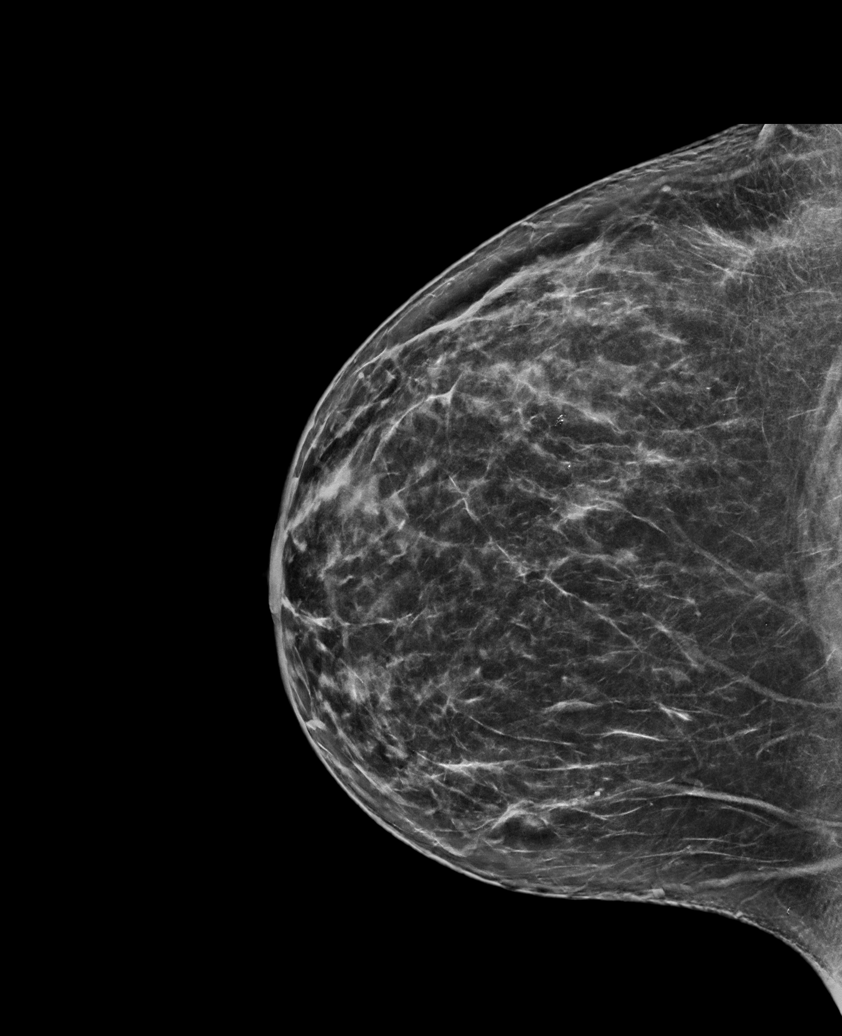

[R CC tomo · tomo slice 41/80.0]
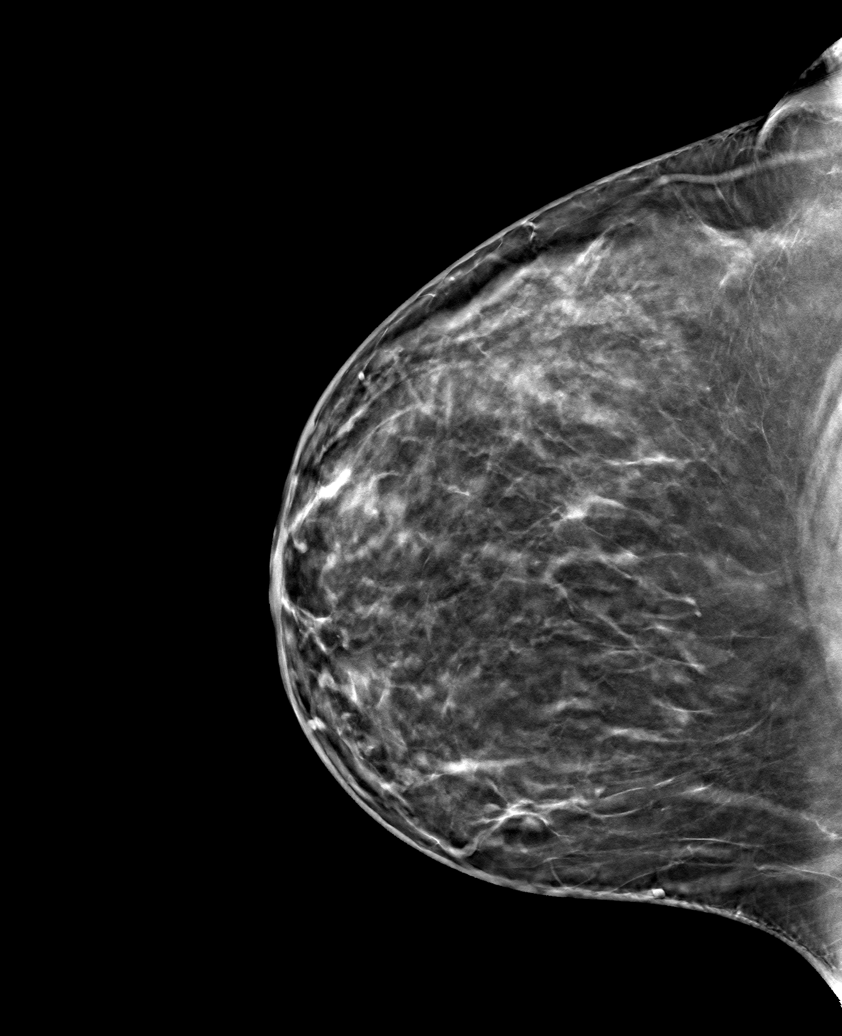

[R MLO tomo · tomo slice 45/89.0]
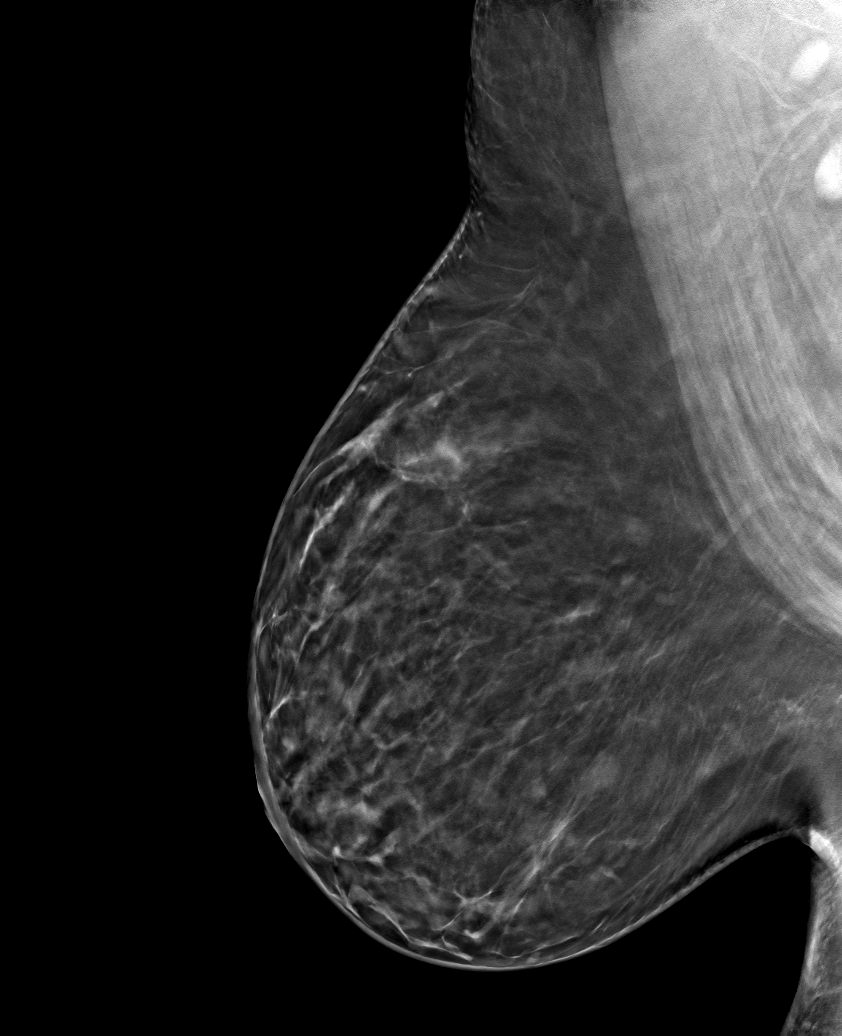

[6 of 14 positions shown; findings below may reference images not displayed]

ACR Breast Density Category b: There are scattered areas of
fibroglandular density.
FINDINGS: Magnified views of calcifications in the LATERAL portion of the
RIGHT breast show a 2 x 3 millimeter group of punctate and rounded
calcifications without suspicious morphology or distribution. No new
or suspicious findings in the RIGHT breast.
IMPRESSION: Stable, probably benign calcifications in the RIGHT breast.

RECOMMENDATION:
Recommend bilateral diagnostic mammogram in 6 months.

I have discussed the findings and recommendations with the patient.
If applicable, a reminder letter will be sent to the patient
regarding the next appointment.

BI-RADS CATEGORY  3: Probably benign.

## 2023-01-15 ENCOUNTER — Ambulatory Visit (INDEPENDENT_AMBULATORY_CARE_PROVIDER_SITE_OTHER): Payer: 59 | Admitting: Internal Medicine

## 2023-01-15 ENCOUNTER — Encounter: Payer: Self-pay | Admitting: Internal Medicine

## 2023-01-15 VITALS — BP 130/88 | HR 95 | Temp 98.1°F | Ht 67.5 in | Wt 290.2 lb

## 2023-01-15 DIAGNOSIS — E1169 Type 2 diabetes mellitus with other specified complication: Secondary | ICD-10-CM | POA: Diagnosis not present

## 2023-01-15 DIAGNOSIS — Z0000 Encounter for general adult medical examination without abnormal findings: Secondary | ICD-10-CM | POA: Diagnosis not present

## 2023-01-15 DIAGNOSIS — E785 Hyperlipidemia, unspecified: Secondary | ICD-10-CM

## 2023-01-15 DIAGNOSIS — Z1211 Encounter for screening for malignant neoplasm of colon: Secondary | ICD-10-CM

## 2023-01-15 DIAGNOSIS — E559 Vitamin D deficiency, unspecified: Secondary | ICD-10-CM | POA: Diagnosis not present

## 2023-01-15 DIAGNOSIS — E119 Type 2 diabetes mellitus without complications: Secondary | ICD-10-CM | POA: Diagnosis not present

## 2023-01-15 DIAGNOSIS — E538 Deficiency of other specified B group vitamins: Secondary | ICD-10-CM | POA: Diagnosis not present

## 2023-01-15 DIAGNOSIS — Z23 Encounter for immunization: Secondary | ICD-10-CM

## 2023-01-15 DIAGNOSIS — Z1159 Encounter for screening for other viral diseases: Secondary | ICD-10-CM

## 2023-01-15 DIAGNOSIS — F411 Generalized anxiety disorder: Secondary | ICD-10-CM

## 2023-01-15 DIAGNOSIS — F17210 Nicotine dependence, cigarettes, uncomplicated: Secondary | ICD-10-CM

## 2023-01-15 MED ORDER — ALPRAZOLAM 0.5 MG PO TABS: 0.5000 mg | ORAL_TABLET | Freq: Three times a day (TID) | ORAL | 1 refills | Status: DC | PRN

## 2023-01-15 NOTE — Addendum Note (Signed)
Addended by: Westley Hummer B on: 01/15/2023 01:38 PM   Modules accepted: Orders

## 2023-01-15 NOTE — Progress Notes (Signed)
Established Patient Office Visit     CC/Reason for Visit: Annual preventive exam and follow-up chronic medical conditions  HPI: Angela Townsend is a 47 y.o. female who is coming in today for the above mentioned reasons. Past Medical History is significant for: Type 2 diabetes, hyperlipidemia, ongoing tobacco use of 4 to 5 cigarettes a day, ADHD who is no longer on Adderall, anxiety on sertraline and as needed alprazolam.  She is doing well and has no acute concerns.  Her eye and dental exams are up-to-date.  She is overdue for Tdap, flu, COVID.  She had a Pap smear recently at [place], obtain records.  She is due for colonoscopy and mammogram.   Past Medical/Surgical History: Past Medical History:  Diagnosis Date   ADD 01/02/2009   Chest pain    DM (diabetes mellitus) ([place])    Headache(784.0) 01/02/2009   HYPERLIPIDEMIA 01/02/2009   Obesity    TOBACCO ABUSE 01/02/2009   Vitamin D deficiency     Past Surgical History:  Procedure Laterality Date   WISDOM TOOTH EXTRACTION      Social History:  reports that she has been smoking cigarettes. She has been smoking an average of .5 packs per day. She has never used smokeless tobacco. She reports current alcohol use. She reports that she does not use drugs.  Allergies: No Known Allergies  Family History:  Family History  Problem Relation Age of Onset   Diabetes Mother    Hyperlipidemia Mother    Birth defects Maternal Grandmother        breast, colon, brain ca     Current Outpatient Medications:    aspirin EC 81 MG tablet, Take 1 tablet (81 mg total) by mouth daily. Swallow whole., Disp: 90 tablet, Rfl: 3   atorvastatin (LIPITOR) 80 MG tablet, TAKE 1 TABLET BY MOUTH DAILY AT  6 PM., Disp: 90 tablet, Rfl: 1   Melatonin 5 MG TABS, Take 5 mg by mouth at bedtime as needed (sleep)., Disp: , Rfl:    metFORMIN (GLUCOPHAGE) 500 MG tablet, Take 1 tablet (500 mg total) by mouth 2 (two) times daily with a meal.,  Disp: 180 tablet, Rfl: 1   sertraline (ZOLOFT) 50 MG tablet, Take 1 tablet (50 mg total) by mouth daily., Disp: 90 tablet, Rfl: 1   ALPRAZolam (XANAX) 0.5 MG tablet, Take 1 tablet (0.5 mg total) by mouth 3 (three) times daily as needed for anxiety (panic attacks)., Disp: 90 tablet, Rfl: 1  Review of Systems:  Negative unless indicated in HPI.   Physical Exam: Vitals:   01/15/23 1303  BP: 130/88  Pulse: 95  Temp: 98.1 F (36.7 C)  TempSrc: Oral  SpO2: 99%  Weight: 290 lb 3.2 oz (131.6 kg)  Height: 5' 7.5" (1.715 m)    Body mass index is 44.78 kg/m.   Physical Exam Vitals reviewed.  Constitutional:      General: She is not in acute distress.    Appearance: Normal appearance. She is not ill-appearing, toxic-appearing or diaphoretic.  HENT:     Head: Normocephalic.     Right Ear: Tympanic membrane, ear canal and external ear normal. There is no impacted cerumen.     Left Ear: Tympanic membrane, ear canal and external ear normal. There is no impacted cerumen.     Nose: Nose normal.     Mouth/Throat:     Mouth: Mucous membranes are moist.     Pharynx: Oropharynx is clear. No oropharyngeal exudate or  posterior oropharyngeal erythema.  Eyes:     General: No scleral icterus.       Right eye: No discharge.        Left eye: No discharge.     Conjunctiva/sclera: Conjunctivae normal.     Pupils: Pupils are equal, round, and reactive to light.  Neck:     Vascular: No carotid bruit.  Cardiovascular:     Rate and Rhythm: Normal rate and regular rhythm.     Pulses: Normal pulses.     Heart sounds: Normal heart sounds.  Pulmonary:     Effort: Pulmonary effort is normal. No respiratory distress.     Breath sounds: Normal breath sounds.  Abdominal:     General: Abdomen is flat. Bowel sounds are normal.     Palpations: Abdomen is soft.  Musculoskeletal:        General: Normal range of motion.     Cervical back: Normal range of motion.  Skin:    General: Skin is warm and dry.   Neurological:     General: No focal deficit present.     Mental Status: She is alert and oriented to person, place, and time. Mental status is at baseline.  Psychiatric:        Mood and Affect: Mood normal.        Behavior: Behavior normal.        Thought Content: Thought content normal.        Judgment: Judgment normal.      Impression and Plan:  Encounter for preventive health examination  Dyslipidemia  Type 2 diabetes mellitus without complication, without long-term current use of insulin ([place]) - Plan: Urine microalbumin-creatinine with uACR, CBC with Differential/Platelet, Comprehensive metabolic panel, Hemoglobin A1c  Vitamin B12 deficiency - Plan: Vitamin B12  Vitamin D deficiency - Plan: Vitamin D, 25-hydroxy  Screening for malignant neoplasm of colon - Plan: Ambulatory referral to Gastroenterology  Anxiety state - Plan: ALPRAZolam (XANAX) 0.5 MG tablet  Hyperlipidemia associated with type 2 diabetes mellitus ([place]) - Plan: Lipid panel  Morbid obesity ([place]) - Plan: TSH  Immunization due  Encounter for hepatitis C screening test for low risk patient - Plan: Hepatitis C antibody  Cigarette nicotine dependence without complication  -Recommend routine eye and dental care. -Healthy lifestyle discussed in detail. -Labs to be updated today. -Prostate cancer screening: Not applicable Health Maintenance  Topic Date Due   Hepatitis C Screening: USPSTF Recommendation to screen - Ages 48-79 yo.  Never done   DTaP/Tdap/Td vaccine (1 - Tdap) Never done   COVID-19 Vaccine (3 - Pfizer risk series) 07/04/2020   Hemoglobin A1C  03/20/2022   Yearly kidney function blood test for diabetes  09/20/2022   Yearly kidney health urinalysis for diabetes  09/20/2022   Flu Shot  02/16/2023*   Eye exam for diabetics  04/15/2023*   Colon Cancer Screening  01/16/2024*   Pap Smear  10/25/2023   Complete foot exam   01/16/2024   HIV Screening  Completed   HPV Vaccine  Aged Out  *Topic  was postponed. The date shown is not the original due date.    -Tdap in office today, declines flu and COVID despite counseling. -Referral placed for initial screening colonoscopy.  She will call to schedule her mammogram, Pap smear has been updated, obtain records. -Diabetic eye exam completed.  Obtain records.      Lelon Frohlich, MD [facility]

## 2023-01-16 LAB — CBC WITH DIFFERENTIAL/PLATELET
Basophils Absolute: 0.1 10*3/uL (ref 0.0–0.1)
Basophils Relative: 1 % (ref 0.0–3.0)
Eosinophils Absolute: 0.2 10*3/uL (ref 0.0–0.7)
Eosinophils Relative: 2.7 % (ref 0.0–5.0)
HCT: 40.9 % (ref 36.0–46.0)
Hemoglobin: 13.5 g/dL (ref 12.0–15.0)
Lymphocytes Relative: 34.5 % (ref 12.0–46.0)
Lymphs Abs: 3.1 10*3/uL (ref 0.7–4.0)
MCHC: 33 g/dL (ref 30.0–36.0)
MCV: 85.7 fl (ref 78.0–100.0)
Monocytes Absolute: 0.4 10*3/uL (ref 0.1–1.0)
Monocytes Relative: 4.4 % (ref 3.0–12.0)
Neutro Abs: 5.1 10*3/uL (ref 1.4–7.7)
Neutrophils Relative %: 57.4 % (ref 43.0–77.0)
Platelets: 264 10*3/uL (ref 150.0–400.0)
RBC: 4.78 Mil/uL (ref 3.87–5.11)
RDW: 15.5 % (ref 11.5–15.5)
WBC: 8.8 10*3/uL (ref 4.0–10.5)

## 2023-01-16 LAB — COMPREHENSIVE METABOLIC PANEL
ALT: 25 U/L (ref 0–35)
AST: 22 U/L (ref 0–37)
Albumin: 4 g/dL (ref 3.5–5.2)
Alkaline Phosphatase: 63 U/L (ref 39–117)
BUN: 10 mg/dL (ref 6–23)
CO2: 23 mEq/L (ref 19–32)
Calcium: 9.6 mg/dL (ref 8.4–10.5)
Chloride: 102 mEq/L (ref 96–112)
Creatinine, Ser: 0.89 mg/dL (ref 0.40–1.20)
GFR: 78.46 mL/min (ref >60.00)
Glucose, Bld: 78 mg/dL (ref 70–99)
Potassium: 4.2 mEq/L (ref 3.5–5.1)
Sodium: 138 mEq/L (ref 135–145)
Total Bilirubin: 1.3 mg/dL — ABNORMAL HIGH (ref 0.2–1.2)
Total Protein: 7.3 g/dL (ref 6.0–8.3)

## 2023-01-16 LAB — LIPID PANEL
Cholesterol: 205 mg/dL — ABNORMAL HIGH (ref 0–200)
HDL: 42.6 mg/dL (ref >39.00)
LDL Cholesterol: 132 mg/dL — ABNORMAL HIGH (ref 0–99)
NonHDL: 162.78
Total CHOL/HDL Ratio: 5
Triglycerides: 152 mg/dL — ABNORMAL HIGH (ref 0.0–149.0)
VLDL: 30.4 mg/dL (ref 0.0–40.0)

## 2023-01-16 LAB — VITAMIN D 25 HYDROXY (VIT D DEFICIENCY, FRACTURES): VITD: 22.09 ng/mL — ABNORMAL LOW (ref 30.00–100.00)

## 2023-01-16 LAB — VITAMIN B12: Vitamin B-12: 218 pg/mL (ref 211–911)

## 2023-01-16 LAB — HEMOGLOBIN A1C: Hgb A1c MFr Bld: 6.6 % — ABNORMAL HIGH (ref 4.6–6.5)

## 2023-01-16 LAB — TSH: TSH: 1.47 u[IU]/mL (ref 0.35–5.50)

## 2023-01-16 LAB — MICROALBUMIN / CREATININE URINE RATIO
Creatinine,U: 138.4 mg/dL
Microalb Creat Ratio: 0.5 mg/g (ref 0.0–30.0)
Microalb, Ur: <0.7 mg/dL (ref 0.0–1.9)

## 2023-01-16 LAB — HEPATITIS C ANTIBODY: Hepatitis C Ab: NONREACTIVE

## 2023-01-20 ENCOUNTER — Other Ambulatory Visit: Payer: Self-pay | Admitting: Internal Medicine

## 2023-01-20 DIAGNOSIS — E559 Vitamin D deficiency, unspecified: Secondary | ICD-10-CM

## 2023-01-20 DIAGNOSIS — E785 Hyperlipidemia, unspecified: Secondary | ICD-10-CM

## 2023-01-20 MED ORDER — VITAMIN D (ERGOCALCIFEROL) 1.25 MG (50000 UNIT) PO CAPS: 50000.0000 [IU] | ORAL_CAPSULE | ORAL | 0 refills | Status: AC

## 2023-01-20 MED ORDER — ATORVASTATIN CALCIUM 80 MG PO TABS: ORAL_TABLET | ORAL | 1 refills | Status: DC

## 2023-01-27 ENCOUNTER — Other Ambulatory Visit: Payer: Self-pay | Admitting: *Deleted

## 2023-01-27 DIAGNOSIS — E119 Type 2 diabetes mellitus without complications: Secondary | ICD-10-CM

## 2023-01-27 DIAGNOSIS — E559 Vitamin D deficiency, unspecified: Secondary | ICD-10-CM

## 2023-01-31 ENCOUNTER — Ambulatory Visit: Payer: 59

## 2023-01-31 ENCOUNTER — Telehealth: Payer: Self-pay

## 2023-01-31 ENCOUNTER — Ambulatory Visit (INDEPENDENT_AMBULATORY_CARE_PROVIDER_SITE_OTHER): Payer: 59

## 2023-01-31 DIAGNOSIS — E538 Deficiency of other specified B group vitamins: Secondary | ICD-10-CM

## 2023-01-31 MED ORDER — CYANOCOBALAMIN 1000 MCG/ML IJ SOLN
1000.0000 ug | Freq: Once | INTRAMUSCULAR | Status: AC
  Administered 2023-01-31: 1000 ug via INTRAMUSCULAR

## 2023-01-31 NOTE — Telephone Encounter (Signed)
Message sent to [facility] for advise

## 2023-01-31 NOTE — Progress Notes (Signed)
Pt here for monthly B12 injection per Dr Jerilee Hoh  B12 1059mcg given IM Left Deltoid, and pt tolerated injection well.  Next B12 injection, pt will call to schedule

## 2023-02-03 NOTE — Telephone Encounter (Signed)
Message sent to [facility] for advise

## 2023-02-03 NOTE — Telephone Encounter (Signed)
B12 injection scheduled 

## 2023-02-07 ENCOUNTER — Ambulatory Visit (INDEPENDENT_AMBULATORY_CARE_PROVIDER_SITE_OTHER): Payer: 59 | Admitting: *Deleted

## 2023-02-07 ENCOUNTER — Telehealth: Payer: Self-pay | Admitting: *Deleted

## 2023-02-07 DIAGNOSIS — E538 Deficiency of other specified B group vitamins: Secondary | ICD-10-CM | POA: Diagnosis not present

## 2023-02-07 MED ORDER — CYANOCOBALAMIN 1000 MCG/ML IJ SOLN
1000.0000 ug | Freq: Once | INTRAMUSCULAR | Status: AC
  Administered 2023-02-07: 1000 ug via INTRAMUSCULAR

## 2023-02-07 NOTE — Progress Notes (Signed)
Per orders of Dr. Legrand Como, injection of Cyanocobalamin 1050mcg given by Agnes Lawrence. Patient tolerated injection well. Dr Legrand Como reviewed the test results which indicates PCP advised the patient to have monthly B12 injections versus weekly.  Dr Legrand Como approved the injection to be given today only, message sent to PCP as Dr Legrand Como advised PCP confirm injection schedule.

## 2023-02-07 NOTE — Telephone Encounter (Signed)
Patient came in as a nurse's visit today for a B12 injection. Dr Legrand Como approved the B12 injection to be given today and message sent to PCP to clarify as test results stated to have the patient begin "monthly", not weekly injections.  Patient is aware PCP will review and she will be contacted for future injections schedule.

## 2023-03-10 ENCOUNTER — Other Ambulatory Visit: Payer: Self-pay | Admitting: Internal Medicine

## 2023-03-10 DIAGNOSIS — E785 Hyperlipidemia, unspecified: Secondary | ICD-10-CM

## 2023-03-12 ENCOUNTER — Other Ambulatory Visit: Payer: Self-pay | Admitting: Gynecology

## 2023-03-12 DIAGNOSIS — R921 Mammographic calcification found on diagnostic imaging of breast: Secondary | ICD-10-CM

## 2023-03-24 ENCOUNTER — Ambulatory Visit
Admission: RE | Admit: 2023-03-24 | Discharge: 2023-03-24 | Disposition: A | Discharge status: Home / Self Care | Payer: 59 | Source: Ambulatory Visit | Attending: Gynecology | Admitting: Gynecology

## 2023-03-24 DIAGNOSIS — R921 Mammographic calcification found on diagnostic imaging of breast: Secondary | ICD-10-CM

## 2023-05-17 LAB — HM DIABETES EYE EXAM

## 2023-06-27 ENCOUNTER — Other Ambulatory Visit: Payer: Self-pay | Admitting: Internal Medicine

## 2023-07-15 ENCOUNTER — Telehealth (INDEPENDENT_AMBULATORY_CARE_PROVIDER_SITE_OTHER): Payer: 59 | Admitting: Family

## 2023-07-15 ENCOUNTER — Encounter: Payer: Self-pay | Admitting: Internal Medicine

## 2023-07-15 DIAGNOSIS — U071 COVID-19: Secondary | ICD-10-CM

## 2023-07-15 MED ORDER — PAXLOVID (300/100) 20 X 150 MG & 10 X 100MG PO TBPK: ORAL_TABLET | ORAL | 0 refills | Status: DC

## 2023-07-15 NOTE — Patient Instructions (Signed)
VISIT SUMMARY:  During your visit, we discussed your recent positive COVID-19 test and the symptoms you've been experiencing, including fever, body aches, sore throat, cough, and runny nose. We also touched on your ongoing management of diabetes with metformin. You mentioned that you're due to start a month-long sabbatical from work soon.  YOUR PLAN:  -COVID-19: You've tested positive for COVID-19, a viral infection that affects the respiratory system. I've prescribed Paxlovid, an antiviral medication, to help reduce the severity of your symptoms and lower the risk of hospitalization. It's important to stay well hydrated, get plenty of rest, and continue managing your symptoms with over-the-counter medications like Dayquil, Nyquil, and Tylenol. You should also quarantine for at least 5 days. If your symptoms are improving and you don't have a fever, you can end your quarantine on day 6, but you should continue wearing a mask for an additional 5 days.  -DIABETES: You have diabetes, a chronic condition that affects how your body processes sugar. You should continue managing this condition with your current medication, metformin.  -WORK-RELATED: You've requested a note for work to confirm your need to quarantine due to COVID-19. I've provided a note advising you to stay out of work until July 21, 2023.  INSTRUCTIONS:  Please start taking the prescribed Paxlovid as directed. Remember to stay well hydrated, rest as much as possible, and continue managing your symptoms with over-the-counter medications. You should quarantine for at least 5 days, and if your symptoms are improving and you don't have a fever, you can end your quarantine on day 6, but continue wearing a mask for an additional 5 days. Continue taking your metformin as usual for your diabetes.

## 2023-07-15 NOTE — Progress Notes (Signed)
MyChart Video Visit    Virtual Visit via Video Note    Patient location: Home. Patient and provider in visit Provider location: Office  I discussed the limitations of evaluation and management by telemedicine and the availability of in person appointments. The patient expressed understanding and agreed to proceed.  Visit Date: 07/15/2023  Today's healthcare provider: Lemont Fillers, NP     Subjective:    Patient ID: Angela Townsend, female    DOB: 22-Mar-1976, 47 y.o.   MRN: [id number]  Chief Complaint  Patient presents with   Covid Positive    Tested positive yesterday, started having symptoms Sunday   Sore Throat    Complains of sore throat with a fever up to 100.4    Generalized Body Aches    HPI  The patient, a [employer] employee with a history of diabetes managed with metformin, presents after testing positive for COVID-19. She reports symptoms of fever, body aches, sore throat, cough, and runny nose that began on Sunday. She has previously had COVID-19 and has not received any COVID-19 vaccines. She has been managing her symptoms with over-the-counter Dayquil and Tylenol, and has also been taking Nyquil to help with sleep. She is due to start a month-long sabbatical from work on Monday.   Past Medical History:  Diagnosis Date   ADD 01/02/2009   Chest pain    DM (diabetes mellitus) (HCC)    Headache(784.0) 01/02/2009   HYPERLIPIDEMIA 01/02/2009   Obesity    TOBACCO ABUSE 01/02/2009   Vitamin D deficiency     Past Surgical History:  Procedure Laterality Date   WISDOM TOOTH EXTRACTION      Family History  Problem Relation Age of Onset   Diabetes Mother    Hyperlipidemia Mother    Birth defects Maternal Grandmother        breast, colon, brain ca    Social History   Socioeconomic History   Marital status: Married    Spouse name: Not on file   Number of children: Not on file   Years of education: Not on file   Highest education  level: Not on file  Occupational History   Not on file  Tobacco Use   Smoking status: Every Day    Current packs/day: 0.50    Types: Cigarettes   Smokeless tobacco: Never  Vaping Use   Vaping status: Never Used  Substance and Sexual Activity   Alcohol use: Yes    Comment: occasional   Drug use: No   Sexual activity: Not on file  Other Topics Concern   Not on file  Social History Narrative   Not on file   Social Determinants of Health   Financial Resource Strain: Not on file  Food Insecurity: Not on file  Transportation Needs: Not on file  Physical Activity: Not on file  Stress: Not on file  Social Connections: Not on file  Intimate Partner Violence: Not on file    Outpatient Medications Prior to Visit  Medication Sig Dispense Refill   ALPRAZolam (XANAX) 0.5 MG tablet Take 1 tablet (0.5 mg total) by mouth 3 (three) times daily as needed for anxiety (panic attacks). 90 tablet 1   aspirin EC 81 MG tablet Take 1 tablet (81 mg total) by mouth daily. Swallow whole. 90 tablet 3   atorvastatin (LIPITOR) 80 MG tablet TAKE 1 TABLET BY MOUTH DAILY AT  6 PM. 90 tablet 1   Melatonin 5 MG TABS Take 5 mg by  mouth at bedtime as needed (sleep).     metFORMIN (GLUCOPHAGE) 500 MG tablet TAKE 1 TABLET BY MOUTH TWICE  DAILY WITH A MEAL 180 tablet 1   sertraline (ZOLOFT) 50 MG tablet Take 1 tablet (50 mg total) by mouth daily. 90 tablet 1   No facility-administered medications prior to visit.    No Known Allergies  ROS See HPI    Objective:    Physical Exam  There were no vitals taken for this visit. Wt Readings from Last 3 Encounters:  01/15/23 290 lb 3.2 oz (131.6 kg)  10/16/22 278 lb (126.1 kg)  11/29/21 278 lb (126.1 kg)    Gen: Awake, alert, no acute distress ENT: voice hoarseness noted Resp: Breathing is even and non-labored Psych: calm/pleasant demeanor Neuro: Alert and Oriented x 3, + facial symmetry, speech is clear.     Assessment & Plan:   Problem List Items  Addressed This Visit       Unprioritized   COVID-19 - Primary    Positive test with symptoms of fever, body aches, sore throat, cough, and runny nose starting on 07/13/2023. Second episode of COVID-19. No vaccination. Comorbidity of diabetes. -Prescribe Paxlovid, an antiviral medication, to decrease severity and risk of hospitalization. -Advise to stay well hydrated, get plenty of rest, and manage symptoms with over-the-counter medications like Dayquil, Nyquil, and Tylenol. (Don't double up tylenol and dayquil/nyquil) -Advise to quarantine for a minimum of 5 days, and if symptoms are improving and no fever, can end quarantine on day 6 with masking for an additional 5 days.       Relevant Medications   nirmatrelvir & ritonavir (PAXLOVID, 300/100,) 20 x 150 MG & 10 x 100MG  TBPK    I am having Angela Townsend start on Paxlovid (300/100). I am also having her maintain her melatonin, aspirin EC, sertraline, ALPRAZolam, atorvastatin, and metFORMIN.  Meds ordered this encounter  Medications   nirmatrelvir & ritonavir (PAXLOVID, 300/100,) 20 x 150 MG & 10 x 100MG  TBPK    Sig: Take per package instructions.    Dispense:  21 tablet    Refill:  0    Order Specific Question:   Supervising Provider    Answer:   Danise Edge A [[id number]]    I discussed the assessment and treatment plan with the patient. The patient was provided an opportunity to ask questions and all were answered. The patient agreed with the plan and demonstrated an understanding of the instructions.   The patient was advised to call back or seek an in-person evaluation if the symptoms worsen or if the condition fails to improve as anticipated.   Lemont Fillers, NP [facility] at [facility] [phone] (phone) [phone] (fax)  [facility]

## 2023-07-15 NOTE — Assessment & Plan Note (Signed)
Positive test with symptoms of fever, body aches, sore throat, cough, and runny nose starting on 07/13/2023. Second episode of COVID-19. No vaccination. Comorbidity of diabetes. -Prescribe Paxlovid, an antiviral medication, to decrease severity and risk of hospitalization. -Advise to stay well hydrated, get plenty of rest, and manage symptoms with over-the-counter medications like Dayquil, Nyquil, and Tylenol. (Don't double up tylenol and dayquil/nyquil) -Advise to quarantine for a minimum of 5 days, and if symptoms are improving and no fever, can end quarantine on day 6 with masking for an additional 5 days.

## 2023-07-29 ENCOUNTER — Other Ambulatory Visit: Payer: Self-pay | Admitting: Internal Medicine

## 2023-07-29 DIAGNOSIS — E785 Hyperlipidemia, unspecified: Secondary | ICD-10-CM

## 2023-08-05 ENCOUNTER — Other Ambulatory Visit: Payer: Self-pay | Admitting: Internal Medicine

## 2023-08-05 DIAGNOSIS — F411 Generalized anxiety disorder: Secondary | ICD-10-CM

## 2023-11-18 ENCOUNTER — Other Ambulatory Visit: Payer: Self-pay | Admitting: Internal Medicine

## 2023-11-27 ENCOUNTER — Encounter: Payer: 59 | Admitting: Internal Medicine

## 2023-12-22 ENCOUNTER — Other Ambulatory Visit: Payer: Self-pay | Admitting: Internal Medicine

## 2023-12-22 ENCOUNTER — Encounter: Payer: 59 | Admitting: Internal Medicine

## 2023-12-22 ENCOUNTER — Encounter: Payer: Self-pay | Admitting: Internal Medicine

## 2023-12-22 ENCOUNTER — Ambulatory Visit: Payer: 59 | Admitting: Internal Medicine

## 2023-12-22 DIAGNOSIS — E785 Hyperlipidemia, unspecified: Secondary | ICD-10-CM

## 2023-12-22 NOTE — Progress Notes (Signed)
 Per patient no change in vitals since last visit, unable to obtain new vitals due to telehealth visit.

## 2023-12-22 NOTE — Progress Notes (Signed)
 This encounter was created in error - please disregard.

## 2023-12-25 ENCOUNTER — Encounter: Payer: Self-pay | Admitting: Internal Medicine

## 2024-01-19 ENCOUNTER — Ambulatory Visit (INDEPENDENT_AMBULATORY_CARE_PROVIDER_SITE_OTHER): Payer: 59 | Admitting: Internal Medicine

## 2024-01-19 ENCOUNTER — Encounter: Payer: Self-pay | Admitting: Internal Medicine

## 2024-01-19 VITALS — BP 128/74 | HR 100 | Temp 97.7°F | Ht 66.0 in | Wt 295.3 lb

## 2024-01-19 DIAGNOSIS — Z7985 Long-term (current) use of injectable non-insulin antidiabetic drugs: Secondary | ICD-10-CM

## 2024-01-19 DIAGNOSIS — E66813 Obesity, class 3: Secondary | ICD-10-CM

## 2024-01-19 DIAGNOSIS — Z0000 Encounter for general adult medical examination without abnormal findings: Secondary | ICD-10-CM

## 2024-01-19 DIAGNOSIS — E559 Vitamin D deficiency, unspecified: Secondary | ICD-10-CM

## 2024-01-19 DIAGNOSIS — Z1211 Encounter for screening for malignant neoplasm of colon: Secondary | ICD-10-CM | POA: Diagnosis not present

## 2024-01-19 DIAGNOSIS — E1169 Type 2 diabetes mellitus with other specified complication: Secondary | ICD-10-CM

## 2024-01-19 DIAGNOSIS — Z6841 Body Mass Index (BMI) 40.0 and over, adult: Secondary | ICD-10-CM

## 2024-01-19 DIAGNOSIS — E538 Deficiency of other specified B group vitamins: Secondary | ICD-10-CM

## 2024-01-19 DIAGNOSIS — E785 Hyperlipidemia, unspecified: Secondary | ICD-10-CM | POA: Diagnosis not present

## 2024-01-19 DIAGNOSIS — E119 Type 2 diabetes mellitus without complications: Secondary | ICD-10-CM

## 2024-01-19 MED ORDER — TIRZEPATIDE 2.5 MG/0.5ML ~~LOC~~ SOAJ: 2.5000 mg | SUBCUTANEOUS | 0 refills | Status: DC

## 2024-01-19 NOTE — Progress Notes (Signed)
 Established Patient Office Visit     CC/Reason for Visit: Annual preventive exam and discuss acute concern  HPI: Angela Townsend is a 48 y.o. female who is coming in today for the above mentioned reasons. Past Medical History is significant for: Type 2 diabetes, hyperlipidemia, ADHD, generalized anxiety disorder.  She has routine eye and dental care.  She is interested in discussing GLP-1 therapy for management of her diabetes and obesity.   Past Medical/Surgical History: Past Medical History:  Diagnosis Date   ADD 01/02/2009   Chest pain    DM (diabetes mellitus) (HCC)    Headache(784.0) 01/02/2009   HYPERLIPIDEMIA 01/02/2009   Obesity    TOBACCO ABUSE 01/02/2009   Vitamin D deficiency     Past Surgical History:  Procedure Laterality Date   WISDOM TOOTH EXTRACTION      Social History:  reports that she has been smoking cigarettes. She has never used smokeless tobacco. She reports current alcohol use. She reports that she does not use drugs.  Allergies: No Known Allergies  Family History:  Family History  Problem Relation Age of Onset   Diabetes Mother    Hyperlipidemia Mother    Birth defects Maternal Grandmother        breast, colon, brain ca     Current Outpatient Medications:    tirzepatide (MOUNJARO) 2.5 MG/0.5ML Pen, Inject 2.5 mg into the skin once a week., Disp: 2 mL, Rfl: 0   ALPRAZolam (XANAX) 0.5 MG tablet, TAKE 1 TABLET (0.5 MG TOTAL) BY MOUTH 3 (THREE) TIMES DAILY AS NEEDED FOR ANXIETY (PANIC ATTACKS)., Disp: 90 tablet, Rfl: 0   aspirin EC 81 MG tablet, Take 1 tablet (81 mg total) by mouth daily. Swallow whole., Disp: 90 tablet, Rfl: 3   atorvastatin (LIPITOR) 80 MG tablet, TAKE 1 TABLET BY MOUTH DAILY AT  6 PM., Disp: 90 tablet, Rfl: 0   Melatonin 5 MG TABS, Take 5 mg by mouth at bedtime as needed (sleep)., Disp: , Rfl:    metFORMIN (GLUCOPHAGE) 500 MG tablet, TAKE 1 TABLET BY MOUTH TWICE  DAILY WITH MEALS, Disp: 180 tablet, Rfl: 0    nirmatrelvir & ritonavir (PAXLOVID, 300/100,) 20 x 150 MG & 10 x 100MG  TBPK, Take per package instructions., Disp: 21 tablet, Rfl: 0   sertraline (ZOLOFT) 50 MG tablet, Take 1 tablet (50 mg total) by mouth daily., Disp: 90 tablet, Rfl: 1  Review of Systems:  Negative unless indicated in HPI.   Physical Exam: Vitals:   01/19/24 1526  BP: 128/74  Pulse: 100  Temp: 97.7 F (36.5 C)  TempSrc: Oral  SpO2: 99%  Weight: 295 lb 4.8 oz (133.9 kg)  Height: 5\' 6"  (1.676 m)    Body mass index is 47.66 kg/m.   Physical Exam Vitals reviewed.  Constitutional:      General: She is not in acute distress.    Appearance: Normal appearance. She is obese. She is not ill-appearing, toxic-appearing or diaphoretic.  HENT:     Head: Normocephalic.     Right Ear: Tympanic membrane, ear canal and external ear normal. There is no impacted cerumen.     Left Ear: Tympanic membrane, ear canal and external ear normal. There is no impacted cerumen.     Nose: Nose normal.     Mouth/Throat:     Mouth: Mucous membranes are moist.     Pharynx: Oropharynx is clear. No oropharyngeal exudate or posterior oropharyngeal erythema.  Eyes:     General: No  scleral icterus.       Right eye: No discharge.        Left eye: No discharge.     Conjunctiva/sclera: Conjunctivae normal.     Pupils: Pupils are equal, round, and reactive to light.  Neck:     Vascular: No carotid bruit.  Cardiovascular:     Rate and Rhythm: Normal rate and regular rhythm.     Pulses: Normal pulses.     Heart sounds: Normal heart sounds.  Pulmonary:     Effort: Pulmonary effort is normal. No respiratory distress.     Breath sounds: Normal breath sounds.  Abdominal:     General: Abdomen is flat. Bowel sounds are normal.     Palpations: Abdomen is soft.  Musculoskeletal:        General: Normal range of motion.     Cervical back: Normal range of motion.  Skin:    General: Skin is warm and dry.  Neurological:     General: No focal  deficit present.     Mental Status: She is alert and oriented to person, place, and time. Mental status is at baseline.  Psychiatric:        Mood and Affect: Mood normal.        Behavior: Behavior normal.        Thought Content: Thought content normal.        Judgment: Judgment normal.     Flowsheet Row Office Visit from 01/15/2023 in [facility] [place]  PHQ-9 Total Score 11        Impression and Plan:  Encounter for preventive health examination  Vitamin B12 deficiency -     Vitamin B12; Future  Vitamin D deficiency -     VITAMIN D 25 Hydroxy (Vit-D Deficiency, Fractures); Future  Type 2 diabetes mellitus without complication, without long-term current use of insulin (HCC) -     CBC with Differential/Platelet; Future -     Comprehensive metabolic panel; Future -     Hemoglobin A1c; Future -     Microalbumin / creatinine urine ratio; Future -     Tirzepatide; Inject 2.5 mg into the skin once a week.  Dispense: 2 mL; Refill: 0  Dyslipidemia -     Lipid panel; Future  Screening for colon cancer -     Ambulatory referral to Gastroenterology  Hyperlipidemia associated with type 2 diabetes mellitus (HCC)  Class 3 severe obesity due to excess calories with serious comorbidity and body mass index (BMI) of 45.0 to 49.9 in adult (HCC) -     TSH; Future   -Recommend routine eye and dental care. -Healthy lifestyle discussed in detail. -Labs to be updated today. -Prostate cancer screening: N/A Health Maintenance  Topic Date Due   Pneumococcal Vaccination (1 of 2 - PCV) Never done   COVID-19 Vaccine (3 - Pfizer risk series) 07/04/2020   Colon Cancer Screening  Never done   Hemoglobin A1C  07/16/2023   Yearly kidney function blood test for diabetes  01/16/2024   Yearly kidney health urinalysis for diabetes  01/16/2024   Flu Shot  02/16/2024*   Eye exam for diabetics  05/16/2024   Complete foot exam   01/18/2025   Pap with HPV screening   10/28/2025   DTaP/Tdap/Td vaccine (2 - Td or Tdap) 01/15/2033   Hepatitis C Screening  Completed   HIV Screening  Completed   HPV Vaccine  Aged Out  *Topic was postponed. The date shown is not the  original due date.    -Due for initial screening colonoscopy. -Has routine follow-up with GYN, mammogram is up-to-date. -We have discussed GLP-1 therapy for both diabetic management and weight loss.  She would like to try [facility].  Start at 2.5 mg weekly.    Chaya Jan, MD [facility] at [facility]

## 2024-01-23 ENCOUNTER — Other Ambulatory Visit

## 2024-01-23 DIAGNOSIS — E66813 Obesity, class 3: Secondary | ICD-10-CM

## 2024-01-23 DIAGNOSIS — E119 Type 2 diabetes mellitus without complications: Secondary | ICD-10-CM

## 2024-01-23 DIAGNOSIS — E785 Hyperlipidemia, unspecified: Secondary | ICD-10-CM

## 2024-01-23 DIAGNOSIS — Z6841 Body Mass Index (BMI) 40.0 and over, adult: Secondary | ICD-10-CM | POA: Diagnosis not present

## 2024-01-23 DIAGNOSIS — E538 Deficiency of other specified B group vitamins: Secondary | ICD-10-CM | POA: Diagnosis not present

## 2024-01-23 DIAGNOSIS — E559 Vitamin D deficiency, unspecified: Secondary | ICD-10-CM

## 2024-01-23 LAB — TSH: TSH: 1.56 u[IU]/mL (ref 0.35–5.50)

## 2024-01-23 LAB — CBC WITH DIFFERENTIAL/PLATELET
Basophils Absolute: 0 10*3/uL (ref 0.0–0.1)
Basophils Relative: 0.5 % (ref 0.0–3.0)
Eosinophils Absolute: 0.3 10*3/uL (ref 0.0–0.7)
Eosinophils Relative: 3.7 % (ref 0.0–5.0)
HCT: 38.8 % (ref 36.0–46.0)
Hemoglobin: 12.6 g/dL (ref 12.0–15.0)
Lymphocytes Relative: 27.9 % (ref 12.0–46.0)
Lymphs Abs: 2 10*3/uL (ref 0.7–4.0)
MCHC: 32.4 g/dL (ref 30.0–36.0)
MCV: 81.9 fl (ref 78.0–100.0)
Monocytes Absolute: 0.3 10*3/uL (ref 0.1–1.0)
Monocytes Relative: 4.4 % (ref 3.0–12.0)
Neutro Abs: 4.6 10*3/uL (ref 1.4–7.7)
Neutrophils Relative %: 63.5 % (ref 43.0–77.0)
Platelets: 265 10*3/uL (ref 150.0–400.0)
RBC: 4.74 Mil/uL (ref 3.87–5.11)
RDW: 16.8 % — ABNORMAL HIGH (ref 11.5–15.5)
WBC: 7.2 10*3/uL (ref 4.0–10.5)

## 2024-01-23 LAB — LIPID PANEL
Cholesterol: 195 mg/dL (ref 0–200)
HDL: 38.8 mg/dL — ABNORMAL LOW (ref >39.00)
LDL Cholesterol: 125 mg/dL — ABNORMAL HIGH (ref 0–99)
NonHDL: 155.97
Total CHOL/HDL Ratio: 5
Triglycerides: 154 mg/dL — ABNORMAL HIGH (ref 0.0–149.0)
VLDL: 30.8 mg/dL (ref 0.0–40.0)

## 2024-01-23 LAB — COMPREHENSIVE METABOLIC PANEL
ALT: 23 U/L (ref 0–35)
AST: 21 U/L (ref 0–37)
Albumin: 4 g/dL (ref 3.5–5.2)
Alkaline Phosphatase: 61 U/L (ref 39–117)
BUN: 12 mg/dL (ref 6–23)
CO2: 21 meq/L (ref 19–32)
Calcium: 9.4 mg/dL (ref 8.4–10.5)
Chloride: 105 meq/L (ref 96–112)
Creatinine, Ser: 0.86 mg/dL (ref 0.40–1.20)
GFR: 81.17 mL/min (ref >60.00)
Glucose, Bld: 145 mg/dL — ABNORMAL HIGH (ref 70–99)
Potassium: 3.8 meq/L (ref 3.5–5.1)
Sodium: 138 meq/L (ref 135–145)
Total Bilirubin: 1.1 mg/dL (ref 0.2–1.2)
Total Protein: 7.1 g/dL (ref 6.0–8.3)

## 2024-01-23 LAB — VITAMIN B12: Vitamin B-12: 376 pg/mL (ref 211–911)

## 2024-01-23 LAB — MICROALBUMIN / CREATININE URINE RATIO
Creatinine,U: 184.4 mg/dL
Microalb Creat Ratio: 29.8 mg/g (ref 0.0–30.0)
Microalb, Ur: 5.5 mg/dL — ABNORMAL HIGH (ref 0.0–1.9)

## 2024-01-23 LAB — VITAMIN D 25 HYDROXY (VIT D DEFICIENCY, FRACTURES): VITD: 21.69 ng/mL — ABNORMAL LOW (ref 30.00–100.00)

## 2024-01-23 LAB — HEMOGLOBIN A1C: Hgb A1c MFr Bld: 7 % — ABNORMAL HIGH (ref 4.6–6.5)

## 2024-01-25 ENCOUNTER — Other Ambulatory Visit: Payer: Self-pay | Admitting: Internal Medicine

## 2024-01-26 ENCOUNTER — Encounter: Payer: Self-pay | Admitting: Internal Medicine

## 2024-01-26 ENCOUNTER — Other Ambulatory Visit: Payer: Self-pay | Admitting: Internal Medicine

## 2024-01-26 DIAGNOSIS — E559 Vitamin D deficiency, unspecified: Secondary | ICD-10-CM

## 2024-01-26 DIAGNOSIS — E785 Hyperlipidemia, unspecified: Secondary | ICD-10-CM

## 2024-01-26 DIAGNOSIS — E1129 Type 2 diabetes mellitus with other diabetic kidney complication: Secondary | ICD-10-CM

## 2024-01-26 MED ORDER — VITAMIN D (ERGOCALCIFEROL) 1.25 MG (50000 UNIT) PO CAPS: 50000.0000 [IU] | ORAL_CAPSULE | ORAL | 0 refills | Status: AC

## 2024-01-26 MED ORDER — EZETIMIBE 10 MG PO TABS: 10.0000 mg | ORAL_TABLET | Freq: Every day | ORAL | 1 refills | Status: DC

## 2024-01-26 MED ORDER — LISINOPRIL 10 MG PO TABS: 10.0000 mg | ORAL_TABLET | Freq: Every day | ORAL | 3 refills | Status: DC

## 2024-01-27 ENCOUNTER — Other Ambulatory Visit: Payer: Self-pay | Admitting: *Deleted

## 2024-01-27 DIAGNOSIS — E1129 Type 2 diabetes mellitus with other diabetic kidney complication: Secondary | ICD-10-CM

## 2024-01-27 DIAGNOSIS — E119 Type 2 diabetes mellitus without complications: Secondary | ICD-10-CM

## 2024-02-19 ENCOUNTER — Encounter: Payer: Self-pay | Admitting: Internal Medicine

## 2024-02-20 ENCOUNTER — Other Ambulatory Visit: Payer: Self-pay | Admitting: Internal Medicine

## 2024-02-20 DIAGNOSIS — E119 Type 2 diabetes mellitus without complications: Secondary | ICD-10-CM

## 2024-02-26 ENCOUNTER — Encounter: Payer: Self-pay | Admitting: Internal Medicine

## 2024-02-26 ENCOUNTER — Other Ambulatory Visit

## 2024-02-26 ENCOUNTER — Ambulatory Visit: Admitting: Internal Medicine

## 2024-02-26 VITALS — BP 120/80 | HR 77 | Temp 98.0°F | Wt 277.8 lb

## 2024-02-26 DIAGNOSIS — Z7985 Long-term (current) use of injectable non-insulin antidiabetic drugs: Secondary | ICD-10-CM

## 2024-02-26 DIAGNOSIS — E1129 Type 2 diabetes mellitus with other diabetic kidney complication: Secondary | ICD-10-CM | POA: Diagnosis not present

## 2024-02-26 DIAGNOSIS — E785 Hyperlipidemia, unspecified: Secondary | ICD-10-CM

## 2024-02-26 DIAGNOSIS — E1169 Type 2 diabetes mellitus with other specified complication: Secondary | ICD-10-CM | POA: Diagnosis not present

## 2024-02-26 DIAGNOSIS — F411 Generalized anxiety disorder: Secondary | ICD-10-CM | POA: Diagnosis not present

## 2024-02-26 DIAGNOSIS — R809 Proteinuria, unspecified: Secondary | ICD-10-CM

## 2024-02-26 DIAGNOSIS — E66813 Obesity, class 3: Secondary | ICD-10-CM

## 2024-02-26 DIAGNOSIS — Z6841 Body Mass Index (BMI) 40.0 and over, adult: Secondary | ICD-10-CM

## 2024-02-26 DIAGNOSIS — E119 Type 2 diabetes mellitus without complications: Secondary | ICD-10-CM

## 2024-02-26 HISTORY — DX: Morbid (severe) obesity due to excess calories: E66.01

## 2024-02-26 MED ORDER — ALPRAZOLAM 0.5 MG PO TABS: 0.5000 mg | ORAL_TABLET | Freq: Three times a day (TID) | ORAL | 0 refills | Status: DC | PRN

## 2024-02-26 MED ORDER — LISINOPRIL 10 MG PO TABS: 10.0000 mg | ORAL_TABLET | Freq: Every day | ORAL | 3 refills | Status: DC

## 2024-02-26 MED ORDER — TIRZEPATIDE 5 MG/0.5ML ~~LOC~~ SOAJ: 5.0000 mg | SUBCUTANEOUS | 0 refills | Status: DC

## 2024-02-26 NOTE — Progress Notes (Signed)
 Established Patient Office Visit     CC/Reason for Visit: Follow-up conditions  HPI: Angela Townsend is a 48 y.o. female who is coming in today for the above mentioned reasons. Past Medical History is significant for: Type 2 diabetes, morbid obesity, hyperlipidemia, microalbuminuria due to diabetes, generalized anxiety disorder.  She has tolerated 2.5 mg of Mounjaro quite well.  Is ready to escalate her dose.  She has lost 18 pounds.  She has not yet started lisinopril that was prescribed due to recent discovery of microalbuminuria.   Past Medical/Surgical History: Past Medical History:  Diagnosis Date   ADD 01/02/2009   Chest pain    DM (diabetes mellitus) (HCC)    Headache(784.0) 01/02/2009   HYPERLIPIDEMIA 01/02/2009   Obesity    TOBACCO ABUSE 01/02/2009   Vitamin D deficiency     Past Surgical History:  Procedure Laterality Date   WISDOM TOOTH EXTRACTION      Social History:  reports that she has been smoking cigarettes. She has never used smokeless tobacco. She reports current alcohol use. She reports that she does not use drugs.  Allergies: No Known Allergies  Family History:  Family History  Problem Relation Age of Onset   Diabetes Mother    Hyperlipidemia Mother    Birth defects Maternal Grandmother        breast, colon, brain ca     Current Outpatient Medications:    atorvastatin (LIPITOR) 80 MG tablet, TAKE 1 TABLET BY MOUTH DAILY AT  6 PM., Disp: 90 tablet, Rfl: 0   cyanocobalamin (VITAMIN B12) 500 MCG tablet, Take 500 mcg by mouth daily., Disp: , Rfl:    ezetimibe (ZETIA) 10 MG tablet, Take 1 tablet (10 mg total) by mouth daily., Disp: 90 tablet, Rfl: 1   Melatonin 5 MG TABS, Take 5 mg by mouth at bedtime as needed (sleep)., Disp: , Rfl:    metFORMIN (GLUCOPHAGE) 500 MG tablet, TAKE 1 TABLET BY MOUTH TWICE  DAILY WITH MEALS, Disp: 180 tablet, Rfl: 1   Multiple Vitamin (MULTIVITAMIN) tablet, Take 1 tablet by mouth daily., Disp: , Rfl:     Omega-3 Fatty Acids (FISH OIL) 300 MG CAPS, Take by mouth., Disp: , Rfl:    sertraline (ZOLOFT) 50 MG tablet, Take 1 tablet (50 mg total) by mouth daily., Disp: 90 tablet, Rfl: 1   tirzepatide (MOUNJARO) 5 MG/0.5ML Pen, Inject 5 mg into the skin once a week., Disp: 2 mL, Rfl: 0   Vitamin D, Ergocalciferol, (DRISDOL) 1.25 MG (50000 UNIT) CAPS capsule, Take 1 capsule (50,000 Units total) by mouth every 7 (seven) days for 12 doses., Disp: 12 capsule, Rfl: 0   ALPRAZolam (XANAX) 0.5 MG tablet, Take 1 tablet (0.5 mg total) by mouth 3 (three) times daily as needed for anxiety (panic attacks)., Disp: 90 tablet, Rfl: 0   lisinopril (ZESTRIL) 10 MG tablet, Take 1 tablet (10 mg total) by mouth daily., Disp: 90 tablet, Rfl: 3  Review of Systems:  Negative unless indicated in HPI.   Physical Exam: Vitals:   02/26/24 0813  BP: 120/80  Pulse: 77  Temp: 98 F (36.7 C)  TempSrc: Oral  SpO2: 98%  Weight: 277 lb 12.8 oz (126 kg)    Body mass index is 44.84 kg/m.   Physical Exam Vitals reviewed.  Constitutional:      Appearance: Normal appearance. She is obese.  HENT:     Head: Normocephalic and atraumatic.  Eyes:     Conjunctiva/sclera: Conjunctivae normal.  Pupils: Pupils are equal, round, and reactive to light.  Cardiovascular:     Rate and Rhythm: Normal rate and regular rhythm.  Pulmonary:     Effort: Pulmonary effort is normal.     Breath sounds: Normal breath sounds.  Skin:    General: Skin is warm and dry.  Neurological:     General: No focal deficit present.     Mental Status: She is alert and oriented to person, place, and time.  Psychiatric:        Mood and Affect: Mood normal.        Behavior: Behavior normal.        Thought Content: Thought content normal.        Judgment: Judgment normal.      Impression and Plan:  Type 2 diabetes mellitus without complication, without long-term current use of insulin (HCC) -     Tirzepatide; Inject 5 mg into the skin once a  week.  Dispense: 2 mL; Refill: 0  Anxiety state -     ALPRAZolam; Take 1 tablet (0.5 mg total) by mouth 3 (three) times daily as needed for anxiety (panic attacks).  Dispense: 90 tablet; Refill: 0  Microalbuminuria due to type 2 diabetes mellitus (HCC) -     Lisinopril; Take 1 tablet (10 mg total) by mouth daily.  Dispense: 90 tablet; Refill: 3  Hyperlipidemia associated with type 2 diabetes mellitus (HCC)  Class 3 severe obesity due to excess calories with serious comorbidity and body mass index (BMI) of 45.0 to 49.9 in adult [facility])  -Congratulated on weight loss success thus far, increase Mounjaro to 5 mg.  Start lisinopril for microalbuminuria.  Check BMP next visit. -Blood pressure is well-controlled. -Recheck lipids next visit due to recent addition of ezetimibe to maximum dose atorvastatin.   Time spent:30 minutes reviewing chart, interviewing and examining patient and formulating plan of care.     Chaya Jan, MD [facility] at [facility]

## 2024-02-27 ENCOUNTER — Other Ambulatory Visit

## 2024-02-28 ENCOUNTER — Other Ambulatory Visit: Payer: Self-pay | Admitting: Internal Medicine

## 2024-02-28 DIAGNOSIS — E785 Hyperlipidemia, unspecified: Secondary | ICD-10-CM

## 2024-03-18 ENCOUNTER — Ambulatory Visit: Admitting: *Deleted

## 2024-03-18 VITALS — Ht 68.0 in | Wt 271.0 lb

## 2024-03-18 DIAGNOSIS — Z1211 Encounter for screening for malignant neoplasm of colon: Secondary | ICD-10-CM

## 2024-03-18 MED ORDER — NA SULFATE-K SULFATE-MG SULF 17.5-3.13-1.6 GM/177ML PO SOLN: 1.0000 | Freq: Once | ORAL | 0 refills | Status: AC

## 2024-03-18 NOTE — Progress Notes (Signed)
 Pt's name and DOB verified at the beginning of the pre-visit wit 2 identifiers   Pt denies any difficulty with ambulating,sitting, laying down or rolling side to side  Pt has no issues with ambulation   Pt has no issues moving head neck or swallowing  No egg or soy allergy known to patient    Patient denies ever being intubated  No FH of Malignant Hyperthermia  Pt is not on diet pills or shots  Pt is not on home 02   Pt is not on blood thinners   Pt has frequent issues with constipation RN instructed pt to use Miralax per bottles instructions a week before prep days. Pt states they will  Pt is not on dialysis  Pt states she has an extra chamber and has had PVST  Pt denies any upcoming cardiac testing  Patient's chart reviewed by Rogena Class CNRA prior to pre-visit and patient appropriate for the [facility].  Pre-visit completed and red dot placed by patient's name on their procedure day (on provider's schedule).     Visit by phone  Pt states weight is 271 lb  IInstructions reviewed. Pt given , [facility] main # and MD on call # prior to instructions.  Pt states understanding of instructions. Instructed to review again prior to procedure. Pt states they will.

## 2024-03-23 ENCOUNTER — Encounter: Payer: Self-pay | Admitting: Internal Medicine

## 2024-03-26 ENCOUNTER — Other Ambulatory Visit: Payer: Self-pay | Admitting: Internal Medicine

## 2024-03-26 ENCOUNTER — Encounter (HOSPITAL_COMMUNITY): Payer: Self-pay

## 2024-03-26 DIAGNOSIS — E119 Type 2 diabetes mellitus without complications: Secondary | ICD-10-CM

## 2024-03-28 ENCOUNTER — Encounter: Payer: Self-pay | Admitting: Internal Medicine

## 2024-04-01 ENCOUNTER — Telehealth: Payer: Self-pay | Admitting: Internal Medicine

## 2024-04-01 NOTE — Progress Notes (Unsigned)
 [facility] History and Physical   Primary Care Physician:  Zilphia Hilt, Charyl Coppersmith, MD   Reason for Procedure:  Colon cancer screening  Plan:    Colonoscopy     HPI: Angela Townsend is a 48 y.o. female presenting for an initial screening colonoscopy.   Past Medical History:  Diagnosis Date   ADD 01/02/2009   Chest pain    DM (diabetes mellitus) (HCC)    Headache(784.0) 01/02/2009   HYPERLIPIDEMIA 01/02/2009   Morbid obesity (HCC) 02/26/2024   bmi 44.84   Obesity    Panic attacks    PSVT (paroxysmal supraventricular tachycardia) (HCC)    Pt states she has an extra chamber in her heart   TOBACCO ABUSE 01/02/2009   Vitamin D  deficiency     Past Surgical History:  Procedure Laterality Date   WISDOM TOOTH EXTRACTION      Prior to Admission medications   Medication Sig Start Date End Date Taking? Authorizing Provider  ALPRAZolam  (XANAX ) 0.5 MG tablet Take 1 tablet (0.5 mg total) by mouth 3 (three) times daily as needed for anxiety (panic attacks). 02/26/24   Raeanne Bull, MD  APPLE CIDER VINEGAR PO Take by mouth.    [provider]  atorvastatin  (LIPITOR) 80 MG tablet TAKE 1 TABLET BY MOUTH DAILY AT  6 PM. 03/01/24   Zilphia Hilt, Charyl Coppersmith, MD  cyanocobalamin  (VITAMIN B12) 500 MCG tablet Take 500 mcg by mouth daily.    [provider]  ergocalciferol , VITAMIN D2, (DRISDOL ) 200 MCG/ML drops Take by mouth daily.    [provider]  ezetimibe  (ZETIA ) 10 MG tablet Take 1 tablet (10 mg total) by mouth daily. 01/26/24   Zilphia Hilt, Charyl Coppersmith, MD  lisinopril  (ZESTRIL ) 10 MG tablet Take 1 tablet (10 mg total) by mouth daily. 02/26/24   Zilphia Hilt, Charyl Coppersmith, MD  Melatonin 5 MG TABS Take 5 mg by mouth at bedtime as needed (sleep).    [provider]  metFORMIN  (GLUCOPHAGE ) 500 MG tablet TAKE 1 TABLET BY MOUTH TWICE  DAILY WITH MEALS 01/26/24   Zilphia Hilt, Charyl Coppersmith, MD  Multiple Vitamin  (MULTIVITAMIN) tablet Take 1 tablet by mouth daily.    [provider]  Omega-3 Fatty Acids (FISH OIL) 300 MG CAPS Take by mouth.    [provider]  sertraline  (ZOLOFT ) 50 MG tablet Take 1 tablet (50 mg total) by mouth daily. 12/09/21   Zilphia Hilt, Charyl Coppersmith, MD  tirzepatide [facility] 5 MG/0.5ML Pen INJECT 5 MG SUBCUTANEOUSLY WEEKLY 03/26/24   Zilphia Hilt, Charyl Coppersmith, MD  Vitamin D , Ergocalciferol , (DRISDOL ) 1.25 MG (50000 UNIT) CAPS capsule Take 1 capsule (50,000 Units total) by mouth every 7 (seven) days for 12 doses. 01/26/24 04/13/24  Raeanne Bull, MD    Current Outpatient Medications  Medication Sig Dispense Refill   atorvastatin  (LIPITOR) 80 MG tablet TAKE 1 TABLET BY MOUTH DAILY AT  6 PM. 90 tablet 1   cyanocobalamin  (VITAMIN B12) 500 MCG tablet Take 500 mcg by mouth daily.     ezetimibe  (ZETIA ) 10 MG tablet Take 1 tablet (10 mg total) by mouth daily. 90 tablet 1   metFORMIN  (GLUCOPHAGE ) 500 MG tablet TAKE 1 TABLET BY MOUTH TWICE  DAILY WITH MEALS 180 tablet 1   Multiple Vitamin (MULTIVITAMIN) tablet Take 1 tablet by mouth daily.     Omega-3 Fatty Acids (FISH OIL) 300 MG CAPS Take by mouth.     sertraline  (ZOLOFT ) 50 MG tablet Take  1 tablet (50 mg total) by mouth daily. 90 tablet 1   Vitamin D , Ergocalciferol , (DRISDOL ) 1.25 MG (50000 UNIT) CAPS capsule Take 1 capsule (50,000 Units total) by mouth every 7 (seven) days for 12 doses. 12 capsule 0   ALPRAZolam  (XANAX ) 0.5 MG tablet Take 1 tablet (0.5 mg total) by mouth 3 (three) times daily as needed for anxiety (panic attacks). 90 tablet 0   APPLE CIDER VINEGAR PO Take by mouth.     ergocalciferol , VITAMIN D2, (DRISDOL ) 200 MCG/ML drops Take by mouth daily.     lisinopril  (ZESTRIL ) 10 MG tablet Take 1 tablet (10 mg total) by mouth daily. 90 tablet 3   Melatonin 5 MG TABS Take 5 mg by mouth at bedtime as needed (sleep).     tirzepatide (MOUNJARO) 5 MG/0.5ML Pen INJECT 5 MG SUBCUTANEOUSLY WEEKLY 6 mL 0    Current Facility-Administered Medications  Medication Dose Route Frequency Provider Last Rate Last Admin   0.9 %  sodium chloride  infusion  500 mL Intravenous Once Kenney Peacemaker, MD        Allergies as of 04/02/2024   (No Known Allergies)    Family History  Problem Relation Age of Onset   Diabetes Mother    Hyperlipidemia Mother    Birth defects Maternal Grandmother        breast, colon, brain ca   Colon cancer Neg Hx    Colon polyps Neg Hx    Esophageal cancer Neg Hx    Rectal cancer Neg Hx    Stomach cancer Neg Hx     Social History   Socioeconomic History   Marital status: Married    Spouse name: Not on file   Number of children: Not on file   Years of education: Not on file   Highest education level: Not on file  Occupational History   Not on file  Tobacco Use   Smoking status: Every Day    Current packs/day: 0.25    Types: Cigarettes   Smokeless tobacco: Never  Vaping Use   Vaping status: Never Used  Substance and Sexual Activity   Alcohol use: Yes    Comment: occasional   Drug use: No   Sexual activity: Yes    Birth control/protection: None    Comment: Vasectomy  Other Topics Concern   Not on file  Social History Narrative   Not on file   Social Drivers of Health   Financial Resource Strain: Not on file  Food Insecurity: Not on file  Transportation Needs: Not on file  Physical Activity: Not on file  Stress: Not on file  Social Connections: Not on file  Intimate Partner Violence: Not on file    Review of Systems:  All other review of systems negative except as mentioned in the HPI.  Physical Exam: Vital signs BP 128/88   Pulse 81   Temp 98.1 F (36.7 C)   Ht 5\' 8"  (1.727 m)   Wt 271 lb (122.9 kg)   SpO2 98%   BMI 41.21 kg/m   General:   Alert,  Well-developed, well-nourished, pleasant and cooperative in NAD Lungs:  Clear throughout to auscultation.   Heart:  Regular rate and rhythm; no murmurs, clicks, rubs,  or  gallops. Abdomen:  Soft, nontender and nondistended. Normal bowel sounds.   Neuro/Psych:  Alert and cooperative. Normal mood and affect. A and O x 3   @Chandlar Guice  Tammie Fall, MD, [facility] [phone] (pager) 04/02/2024 8:06 AM@

## 2024-04-01 NOTE — Telephone Encounter (Signed)
 Returned pts call.  Advised her that she is ok to proceed with procedure tomorrow.

## 2024-04-01 NOTE — Telephone Encounter (Signed)
 Inbound call from patient stating she ate popcorn yesterday. Requesting a call to discuss how to proceed with colonoscopy scheduled for tomorrow 5/16. Please advise, thank you.

## 2024-04-02 ENCOUNTER — Ambulatory Visit: Admitting: Internal Medicine

## 2024-04-02 ENCOUNTER — Encounter: Payer: Self-pay | Admitting: Internal Medicine

## 2024-04-02 VITALS — BP 111/75 | HR 81 | Temp 98.1°F | Resp 16 | Ht 68.0 in | Wt 271.0 lb

## 2024-04-02 DIAGNOSIS — D123 Benign neoplasm of transverse colon: Secondary | ICD-10-CM

## 2024-04-02 DIAGNOSIS — D122 Benign neoplasm of ascending colon: Secondary | ICD-10-CM

## 2024-04-02 DIAGNOSIS — K621 Rectal polyp: Secondary | ICD-10-CM | POA: Diagnosis not present

## 2024-04-02 DIAGNOSIS — Z1211 Encounter for screening for malignant neoplasm of colon: Secondary | ICD-10-CM | POA: Diagnosis present

## 2024-04-02 DIAGNOSIS — D128 Benign neoplasm of rectum: Secondary | ICD-10-CM

## 2024-04-02 MED ORDER — SODIUM CHLORIDE 0.9 % IV SOLN: 500.0000 mL | Freq: Once | INTRAVENOUS | Status: DC

## 2024-04-02 NOTE — Progress Notes (Signed)
 Pt's states no medical or surgical changes since previsit or office visit.

## 2024-04-02 NOTE — Op Note (Signed)
 [facility] Patient Name: Angela Townsend Procedure Date: 04/02/2024 8:40 AM MRN: [id number] Endoscopist: Kenney Peacemaker , MD, [id number] Age: 48 Referring MD:  Date of Birth: October 19, 1976 Gender: Female Account #: [id number] Procedure:                Colonoscopy Indications:              Screening for colorectal malignant neoplasm, This                            is the patient's first colonoscopy Medicines:                Monitored Anesthesia Care Procedure:                Pre-Anesthesia Assessment:                           - Prior to the procedure, a History and Physical                            was performed, and patient medications and                            allergies were reviewed. The patient's tolerance of                            previous anesthesia was also reviewed. The risks                            and benefits of the procedure and the sedation                            options and risks were discussed with the patient.                            All questions were answered, and informed consent                            was obtained. Prior Anticoagulants: The patient has                            taken no anticoagulant or antiplatelet agents. ASA                            Grade Assessment: III - A patient with severe                            systemic disease. After reviewing the risks and                            benefits, the patient was deemed in satisfactory                            condition to undergo the procedure.  After obtaining informed consent, the colonoscope                            was passed under direct vision. Throughout the                            procedure, the patient's blood pressure, pulse, and                            oxygen saturations were monitored continuously. The                            CF [id number] #[id number] was introduced through the anus                            and advanced  to the the cecum, identified by                            appendiceal orifice and ileocecal valve. The                            colonoscopy was performed without difficulty. The                            patient tolerated the procedure well. The quality                            of the bowel preparation was good. The ileocecal                            valve, appendiceal orifice, and rectum were                            photographed. The bowel preparation used was SUPREP                            via split dose instruction. Scope In: 8:55:10 AM Scope Out: 9:08:46 AM Scope Withdrawal Time: 0 hours 9 minutes 46 seconds  Total Procedure Duration: 0 hours 13 minutes 36 seconds  Findings:                 The perianal and digital rectal examinations were                            normal.                           Three sessile polyps were found in the rectum,                            transverse colon and ascending colon. The polyps                            were diminutive in size. These polyps were removed  with a cold snare. Resection and retrieval were                            complete. Verification of patient identification                            for the specimen was done. Estimated blood loss was                            minimal.                           The exam was otherwise without abnormality on                            direct and retroflexion views. Complications:            No immediate complications. Estimated Blood Loss:     Estimated blood loss was minimal. Impression:               - Three diminutive polyps in the rectum, in the                            transverse colon and in the ascending colon,                            removed with a cold snare. Resected and retrieved.                           - The examination was otherwise normal on direct                            and retroflexion views. Recommendation:           -  Patient has a contact number available for                            emergencies. The signs and symptoms of potential                            delayed complications were discussed with the                            patient. Return to normal activities tomorrow.                            Written discharge instructions were provided to the                            patient.                           - Resume previous diet.                           - Continue present medications.                           -  Await pathology results.                           - Repeat colonoscopy is recommended. The                            colonoscopy date will be determined after pathology                            results from today's exam become available for                            review. Kenney Peacemaker, MD 04/02/2024 9:17:58 AM This report has been signed electronically.

## 2024-04-02 NOTE — Progress Notes (Signed)
 Called to room to assist during endoscopic procedure.  Patient ID and intended procedure confirmed with present staff. Received instructions for my participation in the procedure from the performing physician.

## 2024-04-02 NOTE — Progress Notes (Signed)
 Report given to PACU, vss

## 2024-04-02 NOTE — Patient Instructions (Addendum)
 I found and removed 3 small polyps that look benign.  I will let you know pathology results and when to have another routine colonoscopy by mail and/or My Chart.  I appreciate the opportunity to care for you. Kenney Peacemaker, MD, [facility]  Resume previous diet Continue present medications Await pathology results Repeat colonoscopy is recommended. Colonoscopy date determined after pathology results. See handout for polyps  YOU HAD AN ENDOSCOPIC PROCEDURE TODAY AT THE [facility]:   Refer to the procedure report that was given to you for any specific questions about what was found during the examination.  If the procedure report does not answer your questions, please call your gastroenterologist to clarify.  If you requested that your care partner not be given the details of your procedure findings, then the procedure report has been included in a sealed envelope for you to review at your convenience later.  YOU SHOULD EXPECT: Some feelings of bloating in the abdomen. Passage of more gas than usual.  Walking can help get rid of the air that was put into your GI tract during the procedure and reduce the bloating. If you had a lower endoscopy (such as a colonoscopy or flexible sigmoidoscopy) you may notice spotting of blood in your stool or on the toilet paper. If you underwent a bowel prep for your procedure, you may not have a normal bowel movement for a few days.  Please Note:  You might notice some irritation and congestion in your nose or some drainage.  This is from the oxygen used during your procedure.  There is no need for concern and it should clear up in a day or so.  SYMPTOMS TO REPORT IMMEDIATELY:  Following lower endoscopy (colonoscopy or flexible sigmoidoscopy):  Excessive amounts of blood in the stool  Significant tenderness or worsening of abdominal pains  Swelling of the abdomen that is new, acute  Fever of 100F or higher  For urgent or emergent issues, a  gastroenterologist can be reached at any hour by calling [phone]. Do not use MyChart messaging for urgent concerns.   DIET:  We do recommend a small meal at first, but then you may proceed to your regular diet.  Drink plenty of fluids but you should avoid alcoholic beverages for 24 hours.  ACTIVITY:  You should plan to take it easy for the rest of today and you should NOT DRIVE or use heavy machinery until tomorrow (because of the sedation medicines used during the test).    FOLLOW UP: Our staff will call the number listed on your records the next business day following your procedure.  We will call around 7:15- 8:00 am to check on you and address any questions or concerns that you may have regarding the information given to you following your procedure. If we do not reach you, we will leave a message.     If any biopsies were taken you will be contacted by phone or by letter within the next 1-3 weeks.  Please call us  at [phone] if you have not heard about the biopsies in 3 weeks.   SIGNATURES/CONFIDENTIALITY: You and/or your care partner have signed paperwork which will be entered into your electronic medical record.  These signatures attest to the fact that that the information above on your After Visit Summary has been reviewed and is understood.  Full responsibility of the confidentiality of this discharge information lies with you and/or your care-partner.

## 2024-04-05 ENCOUNTER — Telehealth: Payer: Self-pay

## 2024-04-05 NOTE — Telephone Encounter (Signed)
  Follow up Call-     04/02/2024    7:49 AM  Call back number  Post procedure Call Back phone  # [phone]  Permission to leave phone message Yes     Patient questions:  Do you have a fever, pain , or abdominal swelling? No. Pain Score  0 *  Have you tolerated food without any problems? Yes.    Have you been able to return to your normal activities? Yes.    Do you have any questions about your discharge instructions: Diet   No. Medications  No. Follow up visit  No.  Do you have questions or concerns about your Care? No.  Actions: * If pain score is 4 or above: No action needed, pain <4.

## 2024-04-06 LAB — SURGICAL PATHOLOGY

## 2024-04-12 ENCOUNTER — Encounter: Payer: Self-pay | Admitting: Internal Medicine

## 2024-04-12 ENCOUNTER — Ambulatory Visit: Payer: Self-pay | Admitting: Internal Medicine

## 2024-04-12 DIAGNOSIS — Z860101 Personal history of adenomatous and serrated colon polyps: Secondary | ICD-10-CM

## 2024-04-12 HISTORY — DX: Personal history of adenomatous and serrated colon polyps: Z86.0101

## 2024-04-28 ENCOUNTER — Encounter: Payer: Self-pay | Admitting: Internal Medicine

## 2024-04-28 LAB — HM PAP SMEAR

## 2024-04-29 ENCOUNTER — Encounter: Payer: Self-pay | Admitting: Internal Medicine

## 2024-05-04 ENCOUNTER — Other Ambulatory Visit: Payer: Self-pay | Admitting: Internal Medicine

## 2024-05-04 DIAGNOSIS — E559 Vitamin D deficiency, unspecified: Secondary | ICD-10-CM

## 2024-06-12 ENCOUNTER — Other Ambulatory Visit: Payer: Self-pay | Admitting: Internal Medicine

## 2024-06-12 DIAGNOSIS — E119 Type 2 diabetes mellitus without complications: Secondary | ICD-10-CM

## 2024-06-21 ENCOUNTER — Other Ambulatory Visit: Payer: Self-pay | Admitting: Internal Medicine

## 2024-06-28 ENCOUNTER — Other Ambulatory Visit (INDEPENDENT_AMBULATORY_CARE_PROVIDER_SITE_OTHER)

## 2024-06-28 DIAGNOSIS — E119 Type 2 diabetes mellitus without complications: Secondary | ICD-10-CM | POA: Diagnosis not present

## 2024-06-28 LAB — BASIC METABOLIC PANEL WITH GFR
BUN: 15 mg/dL (ref 6–23)
CO2: 26 meq/L (ref 19–32)
Calcium: 9.9 mg/dL (ref 8.4–10.5)
Chloride: 102 meq/L (ref 96–112)
Creatinine, Ser: 0.92 mg/dL (ref 0.40–1.20)
GFR: 74.64 mL/min (ref >60.00)
Glucose, Bld: 88 mg/dL (ref 70–99)
Potassium: 4 meq/L (ref 3.5–5.1)
Sodium: 136 meq/L (ref 135–145)

## 2024-06-30 ENCOUNTER — Ambulatory Visit: Admitting: Internal Medicine

## 2024-06-30 ENCOUNTER — Encounter: Payer: Self-pay | Admitting: Internal Medicine

## 2024-06-30 VITALS — BP 128/80 | HR 96 | Temp 97.8°F | Ht 68.0 in | Wt 262.2 lb

## 2024-06-30 DIAGNOSIS — E1129 Type 2 diabetes mellitus with other diabetic kidney complication: Secondary | ICD-10-CM

## 2024-06-30 DIAGNOSIS — Z7985 Long-term (current) use of injectable non-insulin antidiabetic drugs: Secondary | ICD-10-CM

## 2024-06-30 DIAGNOSIS — E119 Type 2 diabetes mellitus without complications: Secondary | ICD-10-CM

## 2024-06-30 DIAGNOSIS — E1169 Type 2 diabetes mellitus with other specified complication: Secondary | ICD-10-CM

## 2024-06-30 DIAGNOSIS — E785 Hyperlipidemia, unspecified: Secondary | ICD-10-CM | POA: Diagnosis not present

## 2024-06-30 DIAGNOSIS — R809 Proteinuria, unspecified: Secondary | ICD-10-CM | POA: Diagnosis not present

## 2024-06-30 LAB — POCT GLYCOSYLATED HEMOGLOBIN (HGB A1C): HbA1c, POC (prediabetic range): 5.9 % (ref 5.7–6.4)

## 2024-06-30 MED ORDER — VALSARTAN 80 MG PO TABS: 80.0000 mg | ORAL_TABLET | Freq: Every day | ORAL | 1 refills | Status: AC

## 2024-06-30 MED ORDER — TIRZEPATIDE 7.5 MG/0.5ML ~~LOC~~ SOAJ: 7.5000 mg | SUBCUTANEOUS | 0 refills | Status: DC

## 2024-06-30 NOTE — Assessment & Plan Note (Signed)
 Last lipid panel not at goal.  Recheck next visit.  On atorvastatin  80 mg and ezetimibe  10 mg daily.

## 2024-06-30 NOTE — Assessment & Plan Note (Signed)
 Well-controlled with an A1c of 5.9.  Increase Mounjaro  to 7.5 mg weekly.

## 2024-06-30 NOTE — Assessment & Plan Note (Signed)
 Had a cough with lisinopril .  Will discontinue and add valsartan  80 mg.

## 2024-06-30 NOTE — Progress Notes (Signed)
 Established Patient Office Visit     CC/Reason for Visit: Follow-up chronic medical conditions  HPI: Angela Townsend is a 48 y.o. female who is coming in today for the above mentioned reasons. Past Medical History is significant for: Hypertension, hyperlipidemia, type 2 diabetes, obesity, microalbuminuria due to diabetes, generalized anxiety disorder.  She is feeling improved.  She has lost about 30 pounds total.   Past Medical/Surgical History: Past Medical History:  Diagnosis Date   ADD 01/02/2009   Chest pain    DM (diabetes mellitus) (HCC)    Headache(784.0) 01/02/2009   Hx of adenomatous colonic polyps 04/12/2024   2 adenomas recall 2032    HYPERLIPIDEMIA 01/02/2009   Morbid obesity (HCC) 02/26/2024   bmi 44.84   Obesity    Panic attacks    PSVT (paroxysmal supraventricular tachycardia) (HCC)    Pt states she has an extra chamber in her heart   TOBACCO ABUSE 01/02/2009   Vitamin D  deficiency     Past Surgical History:  Procedure Laterality Date   WISDOM TOOTH EXTRACTION      Social History:  reports that she has been smoking cigarettes. She has never used smokeless tobacco. She reports current alcohol use. She reports that she does not use drugs.  Allergies: Allergies  Allergen Reactions   Lisinopril  Cough    Family History:  Family History  Problem Relation Age of Onset   Diabetes Mother    Hyperlipidemia Mother    Birth defects Maternal Grandmother        breast, colon, brain ca   Colon cancer Neg Hx    Colon polyps Neg Hx    Esophageal cancer Neg Hx    Rectal cancer Neg Hx    Stomach cancer Neg Hx      Current Outpatient Medications:    ALPRAZolam  (XANAX ) 0.5 MG tablet, Take 1 tablet (0.5 mg total) by mouth 3 (three) times daily as needed for anxiety (panic attacks)., Disp: 90 tablet, Rfl: 0   APPLE CIDER VINEGAR PO, Take by mouth., Disp: , Rfl:    atorvastatin  (LIPITOR) 80 MG tablet, TAKE 1 TABLET BY MOUTH DAILY AT  6 PM., Disp: 90  tablet, Rfl: 1   cyanocobalamin  (VITAMIN B12) 500 MCG tablet, Take 500 mcg by mouth daily., Disp: , Rfl:    ergocalciferol , VITAMIN D2, (DRISDOL ) 200 MCG/ML drops, Take by mouth daily., Disp: , Rfl:    ezetimibe  (ZETIA ) 10 MG tablet, Take 1 tablet (10 mg total) by mouth daily., Disp: 90 tablet, Rfl: 1   Melatonin 5 MG TABS, Take 5 mg by mouth at bedtime as needed (sleep)., Disp: , Rfl:    metFORMIN  (GLUCOPHAGE ) 500 MG tablet, TAKE 1 TABLET BY MOUTH TWICE  DAILY WITH MEALS, Disp: 180 tablet, Rfl: 0   Multiple Vitamin (MULTIVITAMIN) tablet, Take 1 tablet by mouth daily., Disp: , Rfl:    Omega-3 Fatty Acids (FISH OIL) 300 MG CAPS, Take by mouth., Disp: , Rfl:    sertraline  (ZOLOFT ) 50 MG tablet, Take 1 tablet (50 mg total) by mouth daily., Disp: 90 tablet, Rfl: 1   tirzepatide  (MOUNJARO ) 7.5 MG/0.5ML Pen, Inject 7.5 mg into the skin once a week., Disp: 6 mL, Rfl: 0   valsartan  (DIOVAN ) 80 MG tablet, Take 1 tablet (80 mg total) by mouth daily., Disp: 90 tablet, Rfl: 1  Review of Systems:  Negative unless indicated in HPI.   Physical Exam: Vitals:   06/30/24 0736  BP: 128/80  Pulse: 96  Temp:  97.8 F (36.6 C)  TempSrc: Oral  SpO2: 100%  Weight: 262 lb 4 oz (119 kg)  Height: 5' 8 (1.727 m)    Body mass index is 39.87 kg/m.   Physical Exam Vitals reviewed.  Constitutional:      Appearance: Normal appearance.  HENT:     Head: Normocephalic and atraumatic.  Eyes:     Conjunctiva/sclera: Conjunctivae normal.  Cardiovascular:     Rate and Rhythm: Normal rate and regular rhythm.  Pulmonary:     Effort: Pulmonary effort is normal.     Breath sounds: Normal breath sounds.  Skin:    General: Skin is warm and dry.  Neurological:     General: No focal deficit present.     Mental Status: She is alert and oriented to person, place, and time.  Psychiatric:        Mood and Affect: Mood normal.        Behavior: Behavior normal.        Thought Content: Thought content normal.         Judgment: Judgment normal.      Impression and Plan:  Type 2 diabetes mellitus without complication, without long-term current use of insulin (HCC) Assessment & Plan: Well-controlled with an A1c of 5.9.  Increase Mounjaro  to 7.5 mg weekly.  Orders: -     POCT glycosylated hemoglobin (Hb A1C) -     Tirzepatide ; Inject 7.5 mg into the skin once a week.  Dispense: 6 mL; Refill: 0  Hyperlipidemia associated with type 2 diabetes mellitus (HCC) Assessment & Plan: Last lipid panel not at goal.  Recheck next visit.  On atorvastatin  80 mg and ezetimibe  10 mg daily.   Microalbuminuria due to type 2 diabetes mellitus (HCC) Assessment & Plan: Had a cough with lisinopril .  Will discontinue and add valsartan  80 mg.  Orders: -     Valsartan ; Take 1 tablet (80 mg total) by mouth daily.  Dispense: 90 tablet; Refill: 1     Time spent:31 minutes reviewing chart, interviewing and examining patient and formulating plan of care.     Tully Theophilus Andrews, MD [facility]

## 2024-07-12 ENCOUNTER — Ambulatory Visit: Payer: Self-pay | Admitting: Internal Medicine

## 2024-07-18 ENCOUNTER — Other Ambulatory Visit: Payer: Self-pay | Admitting: Internal Medicine

## 2024-07-18 DIAGNOSIS — F411 Generalized anxiety disorder: Secondary | ICD-10-CM

## 2024-07-28 ENCOUNTER — Other Ambulatory Visit: Payer: Self-pay | Admitting: Internal Medicine

## 2024-07-28 DIAGNOSIS — E785 Hyperlipidemia, unspecified: Secondary | ICD-10-CM

## 2024-08-05 ENCOUNTER — Other Ambulatory Visit: Payer: Self-pay | Admitting: Internal Medicine

## 2024-09-12 ENCOUNTER — Other Ambulatory Visit: Payer: Self-pay | Admitting: Internal Medicine

## 2024-10-03 ENCOUNTER — Encounter: Payer: Self-pay | Admitting: Internal Medicine

## 2024-10-20 ENCOUNTER — Other Ambulatory Visit: Payer: Self-pay | Admitting: Internal Medicine

## 2024-10-20 DIAGNOSIS — E785 Hyperlipidemia, unspecified: Secondary | ICD-10-CM

## 2024-11-05 ENCOUNTER — Other Ambulatory Visit: Payer: Self-pay | Admitting: Internal Medicine

## 2024-11-05 DIAGNOSIS — E119 Type 2 diabetes mellitus without complications: Secondary | ICD-10-CM

## 2024-11-22 ENCOUNTER — Encounter: Payer: Self-pay | Admitting: Internal Medicine

## 2024-11-22 DIAGNOSIS — F411 Generalized anxiety disorder: Secondary | ICD-10-CM

## 2024-11-22 MED ORDER — SERTRALINE HCL 50 MG PO TABS: 50.0000 mg | ORAL_TABLET | Freq: Every day | ORAL | 0 refills | Status: AC
